# Patient Record
Sex: Female | Born: 1955 | Race: White | Hispanic: No | Marital: Married | Smoking: Former smoker
Health system: Southern US, Community
[De-identification: ages and names within clinical notes are randomized; demographics above are authoritative.]

## PROBLEM LIST (undated history)

## (undated) DIAGNOSIS — E7800 Pure hypercholesterolemia, unspecified: Secondary | ICD-10-CM

## (undated) DIAGNOSIS — K219 Gastro-esophageal reflux disease without esophagitis: Secondary | ICD-10-CM

## (undated) DIAGNOSIS — F32A Depression, unspecified: Secondary | ICD-10-CM

## (undated) DIAGNOSIS — F329 Major depressive disorder, single episode, unspecified: Secondary | ICD-10-CM

## (undated) HISTORY — PX: COMBINED HYSTEROSCOPY DIAGNOSTIC / D&C: SUR297

## (undated) HISTORY — PX: TUBAL LIGATION: SHX77

## (undated) HISTORY — PX: TONSILLECTOMY: SUR1361

## (undated) HISTORY — PX: LIPOMA EXCISION: SHX5283

---

## 1993-02-27 DIAGNOSIS — F32A Depression, unspecified: Secondary | ICD-10-CM | POA: Insufficient documentation

## 2006-05-31 ENCOUNTER — Ambulatory Visit (HOSPITAL_COMMUNITY): Admission: RE | Admit: 2006-05-31 | Discharge: 2006-05-31 | Payer: Self-pay | Admitting: Pulmonary Disease

## 2006-06-05 ENCOUNTER — Ambulatory Visit (HOSPITAL_COMMUNITY): Admission: RE | Admit: 2006-06-05 | Discharge: 2006-06-05 | Payer: Self-pay | Admitting: Pulmonary Disease

## 2007-02-05 ENCOUNTER — Ambulatory Visit: Payer: Self-pay | Admitting: Gastroenterology

## 2007-02-06 ENCOUNTER — Ambulatory Visit: Payer: Self-pay | Admitting: Gastroenterology

## 2007-02-06 ENCOUNTER — Ambulatory Visit (HOSPITAL_COMMUNITY): Admission: RE | Admit: 2007-02-06 | Discharge: 2007-02-06 | Payer: Self-pay | Admitting: Gastroenterology

## 2007-02-28 DIAGNOSIS — M810 Age-related osteoporosis without current pathological fracture: Secondary | ICD-10-CM | POA: Insufficient documentation

## 2007-03-19 ENCOUNTER — Ambulatory Visit: Payer: Self-pay | Admitting: Gastroenterology

## 2007-09-26 DIAGNOSIS — O039 Complete or unspecified spontaneous abortion without complication: Secondary | ICD-10-CM | POA: Insufficient documentation

## 2007-09-26 DIAGNOSIS — K648 Other hemorrhoids: Secondary | ICD-10-CM | POA: Insufficient documentation

## 2007-09-26 DIAGNOSIS — N951 Menopausal and female climacteric states: Secondary | ICD-10-CM | POA: Insufficient documentation

## 2007-09-26 DIAGNOSIS — F341 Dysthymic disorder: Secondary | ICD-10-CM

## 2007-09-26 DIAGNOSIS — K625 Hemorrhage of anus and rectum: Secondary | ICD-10-CM

## 2007-09-26 DIAGNOSIS — M545 Low back pain: Secondary | ICD-10-CM

## 2007-09-26 DIAGNOSIS — Z87891 Personal history of nicotine dependence: Secondary | ICD-10-CM

## 2007-09-26 DIAGNOSIS — K921 Melena: Secondary | ICD-10-CM | POA: Insufficient documentation

## 2007-09-26 DIAGNOSIS — J189 Pneumonia, unspecified organism: Secondary | ICD-10-CM | POA: Insufficient documentation

## 2010-06-29 NOTE — Consult Note (Signed)
Daisy Wu NO.:  [id number]   MEDICAL RECORD NO.:  [id number]          PATIENT TYPE:  AMB   LOCATION:  DAY                           FACILITY:  [facility]   PHYSICIAN:  Kassie Mends, M.D.      DATE OF BIRTH:  [birth year]   DATE OF CONSULTATION:  DATE OF DISCHARGE:                                 CONSULTATION   REASON FOR CONSULTATION:  Rectal bleeding.   HISTORY OF PRESENT ILLNESS:  Daisy Wu is a 55 year old female who  was sent for a routine screening colonoscopy.  Upon further triage, she  was complaining of a longstanding history of intermittent rectal  bleeding.  She was therefore brought to the office to discuss this  further prior to venturing into a colonoscopy.  She describes a one-year  history of intermittent bright red blood per rectum in large amounts  with clots at times.  She says that it has definitely increased in  frequency over the last 2 months, especially when she is having a bowel  movement.  She is having severe proctalgia.  She is having at least 2-3  episodes of bleeding per week.  She has noted occasional straining and  the sense of being constipated but does have a bowel movement every day.  She also complains of low back pain for the last 3 weeks, for which she  has taken ibuprofen 400 mg about q.4h. for this.  She denies any  abdominal pain, denies any nausea or vomiting.  Her weight has remained  stable.  Her appetite is good.   PAST MEDICAL/SURGICAL HISTORY:  1. A 3-week history of low back pain.  2. She had pneumonia last year.  3. She has had a miscarriage.  4. She is perimenopausal.  5. She has a history of anxiety and depression and has been on Zoloft      since July 2008.  6. She is status post tonsillectomy.  7. She has had two D&C's and a tubal ligation.   CURRENT MEDICATIONS:  1. Zoloft 50 mg daily.  2. Ibuprofen 400 mg q.4h. p.r.n.   ALLERGIES:  BACTRIM.   FAMILY HISTORY:  Mother does have history of  diverticulitis.  She is age  2.  Father age 76 with history of hypertension, renal lithiasis and  rheumatoid arthritis.  She has two other sisters.   SOCIAL HISTORY:  Daisy Wu is married.  She has one healthy son and  daughter.  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  She has a 12-year history of tobacco use, quitting 21 years ago.  She  consumes about two glasses of wine per week.  Denies any drug use.   REVIEW OF SYSTEMS:  See HPI.  GI:  She denies any heartburn or  indigestion, dysphagia or odynophagia.  Denies any early satiety.  GU:  Denies any dysuria or hematuria, increasing urinary frequency.  HEMATOLOGIC:  Denies any other history of bleeding or easy bruising or  history of blood dyscrasias.   PHYSICAL EXAM:  Weight 140.5 pounds, height 67 inches.  Temperature  97.8, blood pressure  110/78, pulse 60.  GENERAL:  She is a well-developed, well-nourished Caucasian female in no  acute distress.  HEENT:  Sclerae are clear, nonicteric.  Conjunctivae pink.  Oropharynx  pink and moist without any lesions.  NECK:  Supple without any adenopathy or thyromegaly.  CHEST:  Heart regular rate and rhythm, normal S1, S2, without murmurs,  clicks, thrills or gallops.  Lungs are clear to auscultation  bilaterally.  ABDOMEN:  Positive bowel sounds x4.  No bruits auscultated.  Soft,  nontender, nondistended, without palpable mass or hepatosplenomegaly, no  rebound tenderness or guarding.  EXTREMITIES:  Without clubbing or edema bilaterally.  RECTAL:  Deferred given the fact that we are setting her up for  colonoscopy.   IMPRESSION:  Daisy Wu is a 55 year old female who has had  intermittent rectal bleeding, at times in large amount, over the last  year but more recently over the last few months.  Interestingly, she has  been taking a significant amount of ibuprofen over the last 3 weeks,  given low back pain.  I suspect that the ibuprofen could be  contributing  to her bleeding source, unknown, which could include anything from  benign anorectal source such as hemorrhoids or fissure, diverticula, or  polyps.  Less likely would be inflammatory bowel disease or colorectal  carcinoma.   PLAN:  1. Colonoscopy with Dr. Cira Servant in the near future.  I have discussed      the procedure including the risks and benefits, which include but      are not limited to bleeding, infection, perforation, drug reaction.      She agrees and a planned consent will be obtained.  2. She is to hold her ibuprofen until after the colonoscopy.  3. Further recommendations to follow.   We would like to thank Ninfa Linden, NP, for allowing [place] to  participate in the care of Daisy Wu.   ADDENDUM:  TCS: 12/08-Rectal bleeding secondary to hemorrhoids.      Lorenza Burton, N.P.      Kassie Mends, M.D.  Electronically Signed    KJ/MEDQ  D:  02/05/2007  T:  02/05/2007  Job:  [id number]   cc:   Ninfa Linden, NP  [place], [place]

## 2010-06-29 NOTE — Assessment & Plan Note (Signed)
NAMEMarland Kitchen  RISE, TRAEGER              CHART#:  [id number]   DATE:  03/19/2007                       DOB:  03-04-1955   CHIEF COMPLAINT:  Follow-up colonoscopy for hematochezia.   HISTORY OF PRESENT ILLNESS:  Patient is a 55 year old female.  She  underwent ileocolonoscopy for rectal bleeding.  She was found to have  moderate internal hemorrhoids and an otherwise normal exam.  She was  given Anusol-HC suppositories and has done well.  She has had no further  bleeding.  She is feeling much better.  She has not taken as much  ibuprofen as she had previously.  She denies any proctalgia at this  point.  She is having a bowel movement every day.  She is drinking  plenty of water.  Denies any abdominal pain.   CURRENT MEDICATIONS:  See the list from 03/19/07.   ALLERGIES:  Bactrim.   OBJECTIVE:  VITAL SIGNS:  Weight 144 pounds.  Five foot, 7 inches. Temp  98.2.  Blood pressure 98/62.  Pulse 60.  GENERAL:  She is a well-developed and well-nourished Caucasian female in  no acute distress.  HEENT:  Sclerae are clear and nonicteric.  Conjunctivae are pink.  Oropharynx pink and moist without any lesions.  CHEST:  Heart regular rate and rhythm with normal S1 and S2. ABDOMEN:  Positive bowel sounds x4.  No bruits auscultated.  Soft, nontender,  nondistended without palpable mass or hepatosplenomegaly or rebound  tenderness or guarding.  EXTREMITIES:  Without clubbing or edema.   ASSESSMENT:  Patient is a 55 year old female with hemorrhoids as a  source of her rectal bleeding.  Symptoms have resolved at this point.   PLAN:  1. Screening colonoscopy in 10 years.  2. Should she have recurrent proctalgia or rectal bleeding, would      consider retreatment versus      referral to surgeon for hemorrhoidectomy.  3. Continue constipation precautions and high-fiber diet.       Lorenza Burton, N.P.  Electronically Signed     Kassie Mends, M.D.  Electronically Signed    KJ/MEDQ  D:   03/19/2007  T:  03/20/2007  Job:  [id number]

## 2010-06-29 NOTE — Op Note (Signed)
Daisy Wu, Daisy Wu NO.:  [id number]   MEDICAL RECORD NO.:  [id number]          PATIENT TYPE:  AMB   LOCATION:  DAY                           FACILITY:  [facility]   PHYSICIAN:  Kassie Mends, M.D.      DATE OF BIRTH:  Dec 02, 1955   DATE OF PROCEDURE:  02/06/2007  DATE OF DISCHARGE:                               OPERATIVE REPORT   REFERRING PHYSICIAN:  Ninfa Linden, NP, [place], [place].   PROCEDURE:  Ileocolonoscopy.   INDICATION FOR EXAM:  Daisy Wu is a 55 year old female who  presents with rectal bleeding.  She also requires colon cancer  screening. She often has rectal bleeding off and on.  The rectal  bleeding is especially worse with bowel movements.  She bleeds two to  three times a week.  She occasionally strains due to a sense of feeling  constipated.  She also uses ibuprofen frequently for low back pain.   FINDINGS:  1. Normal terminal ileum, 20 cm visualized.  2. Normal colon without evidence of polyps, masses, diverticula,      inflammatory changes or AVMs.  3. Moderate internal hemorrhoids.  Otherwise normal retroflexed view      of the rectum.   DIAGNOSIS:  Daisy Wu rectal bleeding is secondary to  hemorrhoids.   RECOMMENDATIONS:  1. Screening colonoscopy in 10 years.  2. Anusol-HC twice daily for 14 days.  3. She should drink six to eight cups of water daily.  She should have      add Benefiber or Fibersure daily.  She should follow a high fiber      diet.  She is given a handout on high-fiber diet and hemorrhoids.  4. Follow-up appointment in 6 weeks with Lorenza Burton to reevaluate      her rectal bleeding and constipation.  If her rectal bleeding      persists, then she would need a general surgery consultation to      consider hemorrhoidectomy.   MEDICATIONS:  1. Demerol 50 mg IV.  2. Versed 5 mg IV.   PROCEDURE TECHNIQUE:  Physical exam was performed, informed consent was  obtained from the patient after explaining the  benefits, risks and  alternatives to the procedure.  The patient was connected to the monitor  and placed in the left lateral position.  Continuous oxygen was provided  by nasal cannula and IV medicine administered through an indwelling  cannula.  After administration of sedation and rectal exam, the  patient's rectum was intubated  and the scope was advanced under direct visualization to the terminal  ileum.  The scope was removed slowly by carefully examining the color,  texture, anatomy and integrity of the mucosa on the way out.  The  patient was recovered in endoscopy and discharged home in satisfactory  condition.      Kassie Mends, M.D.  Electronically Signed     SM/MEDQ  D:  02/06/2007  T:  02/06/2007  Job:  [id number]   cc:   Ninfa Linden, NP  [place], [place]

## 2010-07-02 NOTE — Procedures (Signed)
Daisy Wu, HEGEMAN             ACCOUNT NO.:  [id number]   MEDICAL RECORD NO.:  [id number]          PATIENT TYPE:  OUT   LOCATION:  RESP                          FACILITY:  [facility]   PHYSICIAN:  Edward L. Juanetta Gosling, M.D.DATE OF BIRTH:  Jul 23, 1955   DATE OF PROCEDURE:  DATE OF DISCHARGE:  06/05/2006                            PULMONARY FUNCTION TEST   1. Spirometry shows no ventilatory defect but does show evidence of      airflow obstruction.  2. Lung volumes are normal.  3. DLCO is normal.  4. There is no significant bronchodilator effect.      Edward L. Juanetta Gosling, M.D.  Electronically Signed     ELH/MEDQ  D:  06/07/2006  T:  06/08/2006  Job:  [id number]

## 2011-11-30 ENCOUNTER — Emergency Department (HOSPITAL_COMMUNITY): Payer: BC Managed Care – PPO

## 2011-11-30 ENCOUNTER — Emergency Department (HOSPITAL_COMMUNITY)
Admission: EM | Admit: 2011-11-30 | Discharge: 2011-12-01 | Disposition: A | Discharge status: Home / Self Care | Payer: BC Managed Care – PPO | Attending: Emergency Medicine | Admitting: Emergency Medicine

## 2011-11-30 ENCOUNTER — Encounter (HOSPITAL_COMMUNITY): Payer: Self-pay | Admitting: Emergency Medicine

## 2011-11-30 DIAGNOSIS — S82899A Other fracture of unspecified lower leg, initial encounter for closed fracture: Secondary | ICD-10-CM | POA: Insufficient documentation

## 2011-11-30 DIAGNOSIS — S82892A Other fracture of left lower leg, initial encounter for closed fracture: Secondary | ICD-10-CM

## 2011-11-30 DIAGNOSIS — X500XXA Overexertion from strenuous movement or load, initial encounter: Secondary | ICD-10-CM | POA: Insufficient documentation

## 2011-11-30 HISTORY — DX: Depression, unspecified: F32.A

## 2011-11-30 HISTORY — DX: Major depressive disorder, single episode, unspecified: F32.9

## 2011-11-30 MED ORDER — OXYCODONE-ACETAMINOPHEN 5-325 MG PO TABS
1.0000 | ORAL_TABLET | Freq: Once | ORAL | Status: AC
  Administered 2011-11-30: 1 via ORAL
  Filled 2011-11-30: qty 1

## 2011-11-30 MED ORDER — OXYCODONE-ACETAMINOPHEN 5-325 MG PO TABS: 1.0000 | ORAL_TABLET | ORAL | Status: DC | PRN

## 2011-11-30 NOTE — ED Notes (Signed)
States around 6 pm was walking and twisted her left ankle causing her to fall to the ground.  States her left ankle is painful and swollen.

## 2011-11-30 NOTE — ED Notes (Signed)
Patient states she was jogging and turned her ankle, falling. Complaining of pain in left ankle. Swelling noted to left ankle at triage.

## 2011-11-30 NOTE — ED Provider Notes (Signed)
History     CSN: [id number]  Arrival date & time 11/30/11  1928   First MD Initiated Contact with Patient 11/30/11 2140      Chief Complaint  Patient presents with  . Ankle Injury    HPI Daisy Wu is a 56 y.o. female who presents to the ED with ankle pain. The pain started when she turned her ankle while jogging today. The pain is located in the left ankle. There is swelling. The history was provided by the patient.  Past Medical History  Diagnosis Date  . Depression     History reviewed. No pertinent past surgical history.  History reviewed. No pertinent family history.  History  Substance Use Topics  . Smoking status: Not on file  . Smokeless tobacco: Not on file  . Alcohol Use: Yes     occ    OB History    Grav Para Term Preterm Abortions TAB SAB Ect Mult Living                  Review of Systems  Musculoskeletal: Positive for joint swelling.       Ankle injury    Allergies  Sulfamethoxazole w-trimethoprim  Home Medications  No current outpatient prescriptions on file.  BP 130/74  Pulse 89  Temp 98.5 F (36.9 C) (Oral)  Resp 14  Ht 5\' 7"  (1.702 m)  Wt 145 lb (65.772 kg)  BMI 22.71 kg/m2  SpO2 100%  Physical Exam  Nursing note and vitals reviewed. Constitutional: She is oriented to person, place, and time. She appears well-developed and well-nourished. No distress.  HENT:  Head: Normocephalic and atraumatic.  Eyes: EOM are normal. Pupils are equal, round, and reactive to light.  Neck: Neck supple.  Cardiovascular: Normal rate.   Pulmonary/Chest: Effort normal.  Musculoskeletal:       Left ankle: She exhibits swelling. tenderness. Lateral malleolus tenderness found.       Feet:       Pedal pulses present, adequate circulation, good touch sensation.  Neurological: She is alert and oriented to person, place, and time. No cranial nerve deficit.  Skin: Skin is warm and dry.  Psychiatric: She has a normal mood and affect. Her behavior is  normal. Judgment and thought content normal.    ED Course  Procedures Labs Reviewed - No data to display Dg Ankle Complete Left  11/30/2011  *RADIOLOGY REPORT*  Clinical Data: Lateral ankle pain after fall.  LEFT ANKLE COMPLETE - 3+ VIEW  Comparison: None.  Findings: There is a transverse fracture of the distal left fibula with overlying soft tissue swelling.  No significant displacement of the fracture fragments.  Distal tibia and talus appear intact. No radiopaque soft tissue foreign bodies.  IMPRESSION: Transverse fracture of the left lateral malleolus with soft tissue swelling.   Original Report Authenticated By: Marlon Pel, M.D.    Assessment: 56 y.o. female with left ankle injury   Ankle fracture  Plan:  Posterior plaster splint   Crutches   Pain management, elevate, ice, follow up with ortho  Discussed with the patient and all questioned fully answered.    Medication List     As of 11/30/2011 11:34 PM    START taking these medications         * oxyCODONE-acetaminophen 5-325 MG per tablet   Commonly known as: PERCOCET/ROXICET   Take 1 tablet by mouth every 4 (four) hours as needed for pain.      * oxyCODONE-acetaminophen 5-325  MG per tablet   Commonly known as: PERCOCET/ROXICET   Take 1 tablet by mouth every 4 (four) hours as needed for pain.     * Notice: This list has 2 medication(s) that are the same as other medications prescribed for you. Read the directions carefully, and ask your doctor or other care provider to review them with you.    ASK your doctor about these medications         alendronate 70 MG tablet   Commonly known as: FOSAMAX      CALCIUM 600 + D PO      ibuprofen 200 MG tablet   Commonly known as: ADVIL,MOTRIN      sertraline 50 MG tablet   Commonly known as: ZOLOFT          Where to get your medications    These are the prescriptions that you need to pick up.   You may get these medications from any pharmacy.          oxyCODONE-acetaminophen 5-325 MG per tablet                 Janne Napoleon, NP 11/30/11 2334

## 2011-11-30 NOTE — ED Provider Notes (Signed)
Medical screening examination/treatment/procedure(s) were performed by non-physician practitioner and as supervising physician I was immediately available for consultation/collaboration. Gillermo Poch, MD, FACEP   Sanyia Dini L Nihal Marzella, MD 11/30/11 2339 

## 2011-12-01 ENCOUNTER — Ambulatory Visit (INDEPENDENT_AMBULATORY_CARE_PROVIDER_SITE_OTHER): Payer: BC Managed Care – PPO | Admitting: Orthopedic Surgery

## 2011-12-01 ENCOUNTER — Encounter: Payer: Self-pay | Admitting: Orthopedic Surgery

## 2011-12-01 VITALS — BP 80/50 | Ht 67.0 in | Wt 145.0 lb

## 2011-12-01 DIAGNOSIS — S82409A Unspecified fracture of shaft of unspecified fibula, initial encounter for closed fracture: Secondary | ICD-10-CM

## 2011-12-01 MED ORDER — HYDROCODONE-ACETAMINOPHEN 7.5-325 MG PO TABS: 1.0000 | ORAL_TABLET | ORAL | Status: DC | PRN

## 2011-12-01 NOTE — Patient Instructions (Addendum)
Weight bearing as tolerated   Crutches as needed   Elevate and ice to control swelling

## 2011-12-01 NOTE — Progress Notes (Signed)
Subjective:     Patient ID: Daisy Wu, female   DOB: [birth year], 56 y.o.   MRN: [id number]  Ankle Injury  Incident onset: 10.16.2013. The incident occurred in the street. The injury mechanism was a fall, an inversion injury and a twisting injury. The pain is present in the left ankle. The quality of the pain is described as stabbing. The pain is at a severity of 8/10. The pain has been intermittent since onset. Associated symptoms include an inability to bear weight, a loss of motion, a loss of sensation, numbness and tingling.     Review of Systems  Constitutional: Negative.   HENT: Negative.   Gastrointestinal: Positive for constipation.  Genitourinary: Negative.   Neurological: Positive for tingling and numbness.       Objective:   Physical Exam  Nursing note and vitals reviewed. Constitutional: She is oriented to person, place, and time. She appears well-developed and well-nourished.  HENT:  Head: Normocephalic.  Eyes: Pupils are equal, round, and reactive to light.  Neck: Normal range of motion.  Cardiovascular: Normal rate and intact distal pulses.   Pulmonary/Chest: Effort normal. No respiratory distress.  Abdominal: Bowel sounds are normal. She exhibits no distension.  Musculoskeletal:       Left ankle  Swelling and tenderness  Decreased range and medial deltoid tenderness  Motor defer pain  Stability defer pain   Neurological: She is oriented to person, place, and time. She has normal reflexes. She displays normal reflexes. No cranial nerve deficit. She exhibits normal muscle tone. Coordination normal.  Skin: Skin is warm and dry. No rash noted. No erythema. No pallor.  Psychiatric: She has a normal mood and affect. Her behavior is normal. Judgment and thought content normal.       Assessment:     X rays Weber A fibula fracture     Plan:     WBAT x 6 weeks in LONG CAM walker   xrays in 6 weeks

## 2012-01-16 ENCOUNTER — Ambulatory Visit (HOSPITAL_COMMUNITY)
Admission: RE | Admit: 2012-01-16 | Discharge: 2012-01-16 | Disposition: A | Discharge status: Home / Self Care | Payer: BC Managed Care – PPO | Source: Ambulatory Visit | Attending: Orthopedic Surgery | Admitting: Orthopedic Surgery

## 2012-01-16 ENCOUNTER — Ambulatory Visit (INDEPENDENT_AMBULATORY_CARE_PROVIDER_SITE_OTHER): Payer: BC Managed Care – PPO | Admitting: Orthopedic Surgery

## 2012-01-16 ENCOUNTER — Encounter: Payer: Self-pay | Admitting: Orthopedic Surgery

## 2012-01-16 VITALS — Ht 67.0 in | Wt 145.0 lb

## 2012-01-16 DIAGNOSIS — S8263XA Displaced fracture of lateral malleolus of unspecified fibula, initial encounter for closed fracture: Secondary | ICD-10-CM | POA: Insufficient documentation

## 2012-01-16 DIAGNOSIS — S82899A Other fracture of unspecified lower leg, initial encounter for closed fracture: Secondary | ICD-10-CM

## 2012-01-16 DIAGNOSIS — X58XXXA Exposure to other specified factors, initial encounter: Secondary | ICD-10-CM | POA: Insufficient documentation

## 2012-01-16 NOTE — Progress Notes (Signed)
Patient ID: Daisy Wu, female   DOB: [birth year], 56 y.o.   MRN: [id number] Chief Complaint  Patient presents with  . Follow-up    6 week recheck on left ankle.Fracture Weber A./Injury date October 16   1. Fracture, ankle  DG Ankle Complete Left   Date of injury October 16  Weber A ankle fracture    Today's x-ray was performed at the refill. Diagnostic center.  X-ray shows that the fractures progressing towards healing in a normal fashion with no displacement  Clinically she is still tender at the fracture site   Continue cam walker x 2 weeks then xr in 4

## 2012-01-16 NOTE — Patient Instructions (Addendum)
Wear boot 2 more weeks

## 2012-02-14 ENCOUNTER — Ambulatory Visit (INDEPENDENT_AMBULATORY_CARE_PROVIDER_SITE_OTHER): Payer: BC Managed Care – PPO

## 2012-02-14 ENCOUNTER — Ambulatory Visit (INDEPENDENT_AMBULATORY_CARE_PROVIDER_SITE_OTHER): Payer: BC Managed Care – PPO | Admitting: Orthopedic Surgery

## 2012-02-14 VITALS — BP 102/70 | Ht 67.0 in | Wt 145.0 lb

## 2012-02-14 DIAGNOSIS — M24876 Other specific joint derangements of unspecified foot, not elsewhere classified: Secondary | ICD-10-CM

## 2012-02-14 DIAGNOSIS — S82899A Other fracture of unspecified lower leg, initial encounter for closed fracture: Secondary | ICD-10-CM

## 2012-02-14 DIAGNOSIS — M25373 Other instability, unspecified ankle: Secondary | ICD-10-CM

## 2012-02-14 NOTE — Patient Instructions (Addendum)
aso brace x 4 weeks left ankle   Please call the hospital to START PHYSICAL THERAPY

## 2012-02-14 NOTE — Progress Notes (Signed)
Patient ID: Daisy Wu, female   DOB: Mar 06, 1955, 56 y.o.   MRN: [id number] Chief Complaint  Patient presents with  . Follow-up    4 week recheck on left ankle fracture with xray. DOI 11-30-11.    Left distal fibula fracture. Followup x-ray  Patient went to pain anterior to the bone along the lateral collateral ligaments and also has a new injury fell and injured her right ankle with a sprain  Exam on the left side shows no tenderness at the fracture site. Negative palpation and percussion test. But she is tender with loose ligaments anterior talofibular calcaneofibular  Range of motion is normal. She has abnormal drawer test was painful inversion  Right side abnormal drawer test sulcus sign, mild tenderness normal range of motion  Diagnosis #1 fibular fracture healed Diagnosis #2 ankle sprain chronic Diagnoses #3 instability ankle bilateral  Recommend ASO brace on the left  Physical therapy  Return 6 weeks

## 2012-03-31 ENCOUNTER — Other Ambulatory Visit: Payer: Self-pay

## 2012-04-03 ENCOUNTER — Ambulatory Visit (INDEPENDENT_AMBULATORY_CARE_PROVIDER_SITE_OTHER): Payer: BC Managed Care – PPO | Admitting: Orthopedic Surgery

## 2012-04-03 VITALS — BP 120/60 | Ht 67.0 in | Wt 145.0 lb

## 2012-04-03 DIAGNOSIS — S93409A Sprain of unspecified ligament of unspecified ankle, initial encounter: Secondary | ICD-10-CM

## 2012-04-03 DIAGNOSIS — S82409A Unspecified fracture of shaft of unspecified fibula, initial encounter for closed fracture: Secondary | ICD-10-CM

## 2012-04-03 NOTE — Patient Instructions (Addendum)
Apply aspercreme or capzacin tid as needed   Need exercises to continue on the left side

## 2012-04-03 NOTE — Progress Notes (Signed)
Patient ID: Daisy Wu, female   DOB: Sep 22, 1955, 57 y.o.   MRN: [id number] Chief Complaint  Patient presents with  . Follow-up    follow up post bilateral ankle injuries and PT    Patient reports continued pain in her left ankle especially she's going down the steps. She's completed her physical therapy course as well as a home physical therapy course after bilateral ankle injuries including a distal fibular avulsion type fracture.  Review of systems she denies any neurologic complaints of numbness and tingling in the foot or ankle either side  Bilateral ankle examination for starting with the right ankle. 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  On the left side she also has a bunion deformity flat feet which is flexible. She has some tenderness around the ankle joint fibula is nontender range of motion is full she can perform double leg tiptoe as well as a single leg tiptoe although on the right side this is painful  Fracture of fibula  First degree ankle sprain   Overall she is improved recommend continued exercises as well as topical anti-inflammatories as needed.  She does not want to address the bunion deformities at this time they are not painful or bothering her.  Followup as needed

## 2012-12-20 ENCOUNTER — Other Ambulatory Visit: Payer: Self-pay

## 2017-01-17 ENCOUNTER — Encounter: Payer: Self-pay | Admitting: Gastroenterology

## 2017-02-02 ENCOUNTER — Other Ambulatory Visit: Payer: Self-pay | Admitting: *Deleted

## 2017-02-02 ENCOUNTER — Encounter: Payer: Self-pay | Admitting: *Deleted

## 2017-02-02 ENCOUNTER — Encounter: Payer: Self-pay | Admitting: Gastroenterology

## 2017-02-02 ENCOUNTER — Ambulatory Visit: Payer: BC Managed Care – PPO | Admitting: Gastroenterology

## 2017-02-02 VITALS — BP 114/74 | HR 82 | Temp 97.4°F | Ht 65.25 in | Wt 149.8 lb

## 2017-02-02 DIAGNOSIS — K648 Other hemorrhoids: Secondary | ICD-10-CM

## 2017-02-02 DIAGNOSIS — K625 Hemorrhage of anus and rectum: Secondary | ICD-10-CM

## 2017-02-02 MED ORDER — CLENPIQ 10-3.5-12 MG-GM -GM/160ML PO SOLN: 1.0000 | Freq: Once | ORAL | 0 refills | Status: AC

## 2017-02-02 NOTE — Progress Notes (Addendum)
Primary Care Physician:  Asencion Noble, MD  Primary Gastroenterologist:  Barney Drain, MD REVIEWED-NO ADDITIONAL RECOMMENDATIONS.  Chief Complaint  Patient presents with  . Colonoscopy    last tcs at least 10 yrs ago  . Rectal Bleeding  . Hemorrhoids    HPI:  Daisy Wu is a 61 y.o. female here to discuss colonoscopy.  Last colonoscopy 10 years ago, she had moderate internal hemorrhoids at the time.  She complains of ongoing issues with intermittent rectal bleeding which she feels is hemorrhoid related.  Does not happen all the time but can happen for weeks at a time and then she gets some break in between.  Has been happening for several years.  No melena.  No rectal pain.  No constipation or diarrhea.  No weight loss.  No upper GI symptoms.  Current Outpatient Medications  Medication Sig Dispense Refill  . Calcium Carbonate-Vitamin D (CALCIUM 600 + D PO) Take 1 tablet by mouth daily.    . cholecalciferol (VITAMIN D) 1000 units tablet Take 1,000 Units by mouth daily.    . ferrous sulfate 325 (65 FE) MG tablet Take 325 mg by mouth daily.    . sertraline (ZOLOFT) 50 MG tablet Take 25 mg by mouth every other day.      No current facility-administered medications for this visit.     Allergies as of 02/02/2017 - Review Complete 02/02/2017  Allergen Reaction Noted  . Sulfamethoxazole-trimethoprim      Past Medical History:  Diagnosis Date  . Depression     Past Surgical History:  Procedure Laterality Date  . COMBINED HYSTEROSCOPY DIAGNOSTIC / D&C    . TONSILLECTOMY    . TUBAL LIGATION      Family History  Problem Relation Age of Onset  . Heart disease Unknown   . Arthritis Unknown   . Cancer Unknown     Social History   Socioeconomic History  . Marital status: Married    Spouse name: Not on file  . Number of children: Not on file  . Years of education: Not on file  . Highest education level: Not on file  Social Needs  . Financial resource strain: Not on file   . Food insecurity - worry: Not on file  . Food insecurity - inability: Not on file  . Transportation needs - medical: Not on file  . Transportation needs - non-medical: Not on file  Occupational History  . Not on file  Tobacco Use  . Smoking status: Former Research scientist (life sciences)  . Smokeless tobacco: Never Used  Substance and Sexual Activity  . Alcohol use: Yes    Comment: bottle of wine every other week  . Drug use: No  . Sexual activity: Not on file  Other Topics Concern  . Not on file  Social History Narrative  . Not on file      ROS:  General: Negative for anorexia, weight loss, fever, chills, fatigue, weakness. Eyes: Negative for vision changes.  ENT: Negative for hoarseness, difficulty swallowing , nasal congestion. CV: Negative for chest pain, angina, palpitations, dyspnea on exertion, peripheral edema.  Respiratory: Negative for dyspnea at rest, dyspnea on exertion, cough, sputum, wheezing.  GI: See history of present illness. GU:  Negative for dysuria, hematuria, urinary incontinence, urinary frequency, nocturnal urination.  MS: Negative for joint pain, low back pain.  Derm: Negative for rash or itching.  Neuro: Negative for weakness, abnormal sensation, seizure, frequent headaches, memory loss, confusion.  Psych: Negative for anxiety, depression, suicidal ideation,  hallucinations.  Endo: Negative for unusual weight change.  Heme: Negative for bruising or bleeding. Allergy: Negative for rash or hives.    Physical Examination:  BP 114/74   Pulse 82   Temp (!) 97.4 F (36.3 C) (Oral)   Ht 5' 5.25" (1.657 m)   Wt 149 lb 12.8 oz (67.9 kg)   BMI 24.74 kg/m    General: Well-nourished, well-developed in no acute distress.  Head: Normocephalic, atraumatic.   Eyes: Conjunctiva pink, no icterus. Mouth: Oropharyngeal mucosa moist and pink , no lesions erythema or exudate. Neck: Supple without thyromegaly, masses, or lymphadenopathy.  Lungs: Clear to auscultation bilaterally.   Heart: Regular rate and rhythm, no murmurs rubs or gallops.  Abdomen: Bowel sounds are normal, nontender, nondistended, no hepatosplenomegaly or masses, no abdominal bruits or    hernia , no rebound or guarding.   Rectal: Deferred Extremities: No lower extremity edema. No clubbing or deformities.  Neuro: Alert and oriented x 4 , grossly normal neurologically.  Skin: Warm and dry, no rash or jaundice.   Psych: Alert and cooperative, normal mood and affect.

## 2017-02-02 NOTE — Assessment & Plan Note (Signed)
61 year old female presenting to schedule 10-year follow-up colonoscopy but she complains of intermittent rectal bleeding possibly related to hemorrhoids.  She would like something done about her hemorrhoids.  Recommend colonoscopy with possible hemorrhoid banding in the near future.  Hold iron 7 days prior to procedure.  I have discussed the risks, alternatives, benefits with regards to but not limited to the risk of reaction to medication, bleeding, infection, perforation and the patient is agreeable to proceed. Written consent to be obtained.

## 2017-02-02 NOTE — Patient Instructions (Signed)
1. Colonoscopy with possible hemorrhoid banding as scheduled. See separate instructions.

## 2017-02-02 NOTE — Progress Notes (Signed)
CC'D TO PCP °

## 2017-02-20 ENCOUNTER — Encounter (HOSPITAL_COMMUNITY): Payer: Self-pay | Admitting: *Deleted

## 2017-02-20 ENCOUNTER — Ambulatory Visit (HOSPITAL_COMMUNITY)
Admission: RE | Admit: 2017-02-20 | Discharge: 2017-02-20 | Disposition: A | Discharge status: Home / Self Care | Payer: BC Managed Care – PPO | Source: Ambulatory Visit | Attending: Gastroenterology | Admitting: Gastroenterology

## 2017-02-20 ENCOUNTER — Other Ambulatory Visit: Payer: Self-pay

## 2017-02-20 ENCOUNTER — Encounter (HOSPITAL_COMMUNITY)
Admission: RE | Disposition: A | Discharge status: Home / Self Care | Payer: Self-pay | Source: Ambulatory Visit | Attending: Gastroenterology

## 2017-02-20 DIAGNOSIS — Q438 Other specified congenital malformations of intestine: Secondary | ICD-10-CM | POA: Diagnosis not present

## 2017-02-20 DIAGNOSIS — K648 Other hemorrhoids: Secondary | ICD-10-CM | POA: Diagnosis not present

## 2017-02-20 DIAGNOSIS — Z87891 Personal history of nicotine dependence: Secondary | ICD-10-CM | POA: Diagnosis not present

## 2017-02-20 DIAGNOSIS — F329 Major depressive disorder, single episode, unspecified: Secondary | ICD-10-CM | POA: Diagnosis not present

## 2017-02-20 DIAGNOSIS — Z882 Allergy status to sulfonamides status: Secondary | ICD-10-CM | POA: Diagnosis not present

## 2017-02-20 DIAGNOSIS — K921 Melena: Secondary | ICD-10-CM | POA: Diagnosis present

## 2017-02-20 DIAGNOSIS — Z79899 Other long term (current) drug therapy: Secondary | ICD-10-CM | POA: Diagnosis not present

## 2017-02-20 DIAGNOSIS — K644 Residual hemorrhoidal skin tags: Secondary | ICD-10-CM | POA: Insufficient documentation

## 2017-02-20 DIAGNOSIS — K625 Hemorrhage of anus and rectum: Secondary | ICD-10-CM

## 2017-02-20 HISTORY — PX: COLONOSCOPY: SHX5424

## 2017-02-20 HISTORY — PX: HEMORRHOID BANDING: SHX5850

## 2017-02-20 SURGERY — COLONOSCOPY
Anesthesia: Moderate Sedation

## 2017-02-20 MED ORDER — MEPERIDINE HCL 100 MG/ML IJ SOLN
INTRAMUSCULAR | Status: DC | PRN
  Administered 2017-02-20 (×2): 25 mg via INTRAVENOUS

## 2017-02-20 MED ORDER — SODIUM CHLORIDE 0.9 % IV SOLN
INTRAVENOUS | Status: DC
  Administered 2017-02-20: 09:00:00 via INTRAVENOUS

## 2017-02-20 MED ORDER — MIDAZOLAM HCL 5 MG/5ML IJ SOLN
INTRAMUSCULAR | Status: DC | PRN
  Administered 2017-02-20: 1 mg via INTRAVENOUS
  Administered 2017-02-20 (×2): 2 mg via INTRAVENOUS

## 2017-02-20 MED ORDER — MIDAZOLAM HCL 5 MG/5ML IJ SOLN
INTRAMUSCULAR | Status: AC
  Filled 2017-02-20: qty 10

## 2017-02-20 MED ORDER — MEPERIDINE HCL 100 MG/ML IJ SOLN
INTRAMUSCULAR | Status: AC
  Filled 2017-02-20: qty 2

## 2017-02-20 MED ORDER — STERILE WATER FOR IRRIGATION IR SOLN
Status: DC | PRN
  Administered 2017-02-20: 10:00:00

## 2017-02-20 NOTE — Op Note (Signed)
[facility] Patient Name: Daisy Wu Procedure Date: 02/20/2017 9:13 AM MRN: [id number] Date of Birth: 1955-12-17 Attending MD: Barney Drain MD, MD CSN: [id number] Age: 62 Admit Type: Outpatient Procedure:                Colonoscopy, DIAGNOSTIC Indications:              Hematochezia: < 1-2 TIMES PER MO Providers:                Barney Drain MD, MD, Otis Peak B. Sharon Seller, RN, Janeece Riggers, RN Referring MD:             Asencion Noble Medicines:                Meperidine 50 mg IV, Midazolam 5 mg IV Complications:            No immediate complications. Estimated Blood Loss:     Estimated blood loss: none. Procedure:                Pre-Anesthesia Assessment:                           - Prior to the procedure, a History and Physical                            was performed, and patient medications and                            allergies were reviewed. The patient's tolerance of                            previous anesthesia was also reviewed. The risks                            and benefits of the procedure and the sedation                            options and risks were discussed with the patient.                            All questions were answered, and informed consent                            was obtained. Prior Anticoagulants: The patient has                            taken no previous anticoagulant or antiplatelet                            agents. ASA Grade Assessment: II - A patient with                            mild systemic disease. After reviewing the risks  and benefits, the patient was deemed in                            satisfactory condition to undergo the procedure.                            After obtaining informed consent, the colonoscope                            was passed under direct vision. Throughout the                            procedure, the patient's blood pressure, pulse, and           oxygen saturations were monitored continuously. The                            EC-[id number] ([id number]) scope was introduced through                            the anus and advanced to the 5 cm into the ileum.                            The colonoscopy was technically difficult and                            complex due to a tortuous colon. Successful                            completion of the procedure was aided by increasing                            the dose of sedation medication, straightening and                            shortening the scope to obtain bowel loop reduction                            and COLOWRAP. The patient tolerated the procedure                            fairly well. The quality of the bowel preparation                            was excellent. The terminal ileum, ileocecal valve,                            appendiceal orifice, and rectum were photographed. Scope In: 9:42:37 AM Scope Out: 10:00:17 AM Scope Withdrawal Time: 0 hours 10 minutes 31 seconds  Total Procedure Duration: 0 hours 17 minutes 40 seconds  Findings:      The terminal ileum appeared normal.      The recto-sigmoid colon, sigmoid colon and descending colon were       moderately redundant.  The exam was otherwise without abnormality.      Non-bleeding internal hemorrhoids were found during retroflexion. The       hemorrhoids were small.      External hemorrhoids were found during retroflexion. The hemorrhoids       were moderate. Impression:               - The examined portion of the ileum was normal.                           - MODERATELY Redundant LEFT colon.                           - The examination was otherwise normal.                           - RECTAL BLEEDING DUE TO SMALL internal hemorrhoids.                           - External hemorrhoids. Moderate Sedation:      Moderate (conscious) sedation was administered by the endoscopy nurse       and supervised by the  endoscopist. The following parameters were       monitored: oxygen saturation, heart rate, blood pressure, and response       to care. Total physician intraservice time was 27 minutes. Recommendation:           - Repeat colonoscopy in 10 years for surveillance.                           - High fiber diet.                           - Continue present medications.                           - Patient has a contact number available for                            emergencies. The signs and symptoms of potential                            delayed complications were discussed with the                            patient. Return to normal activities tomorrow.                            Written discharge instructions were provided to the                            patient. Procedure Code(s):        --- Professional ---                           [phone], Colonoscopy, flexible; diagnostic, including  collection of specimen(s) by brushing or washing,                            when performed (separate procedure)                           99152, Moderate sedation services provided by the                            same physician or other qualified health care                            professional performing the diagnostic or                            therapeutic service that the sedation supports,                            requiring the presence of an independent trained                            observer to assist in the monitoring of the                            patient's level of consciousness and physiological                            status; initial 15 minutes of intraservice time,                            patient age 87 years or older                           [phone], Moderate sedation services; each additional                            15 minutes intraservice time Diagnosis Code(s):        --- Professional ---                           K64.4, Residual  hemorrhoidal skin tags                           K64.8, Other hemorrhoids                           K92.1, Melena (includes Hematochezia)                           Q43.8, Other specified congenital malformations of                            intestine CPT copyright 2016 American [employer] Association. All rights reserved. The codes documented in this report are preliminary and upon coder review may  be revised to meet current compliance requirements. Barney Drain, MD Barney Drain  MD, MD 02/20/2017 10:10:15 AM This report has been signed electronically. Number of Addenda: 0

## 2017-02-20 NOTE — H&P (Signed)
  Primary Care Physician:  Asencion Noble, MD Primary Gastroenterologist:  Dr. Oneida Alar  Pre-Procedure History & Physical: HPI:  Daisy Wu is a 62 y.o. female here for RECTAL BLEEDING.  Past Medical History:  Diagnosis Date  . Depression     Past Surgical History:  Procedure Laterality Date  . COMBINED HYSTEROSCOPY DIAGNOSTIC / D&C    . TONSILLECTOMY    . TUBAL LIGATION      Prior to Admission medications   Medication Sig Start Date End Date Taking? Authorizing Provider  Calcium Carbonate-Vitamin D (CALCIUM 600 + D PO) Take 1 tablet by mouth daily.   Yes [provider]  cholecalciferol (VITAMIN D) 1000 units tablet Take 1,000 Units by mouth daily.   Yes [provider]  ferrous sulfate 325 (65 FE) MG tablet Take 325 mg by mouth daily.   Yes [provider]  sertraline (ZOLOFT) 100 MG tablet Take 50 mg by mouth every other day.    Yes [provider]    Allergies as of 02/02/2017 - Review Complete 02/02/2017  Allergen Reaction Noted  . Sulfamethoxazole-trimethoprim      Family History  Problem Relation Age of Onset  . Heart disease Unknown   . Arthritis Unknown   . Cancer Unknown     Social History   Socioeconomic History  . Marital status: Married    Spouse name: Not on file  . Number of children: Not on file  . Years of education: Not on file  . Highest education level: Not on file  Social Needs  . Financial resource strain: Not on file  . Food insecurity - worry: Not on file  . Food insecurity - inability: Not on file  . Transportation needs - medical: Not on file  . Transportation needs - non-medical: Not on file  Occupational History  . Not on file  Tobacco Use  . Smoking status: Former Research scientist (life sciences)  . Smokeless tobacco: Never Used  Substance and Sexual Activity  . Alcohol use: Yes    Comment: bottle of wine every other week  . Drug use: No  . Sexual activity: Not on file  Other Topics Concern  . Not on file  Social  History Narrative  . Not on file    Review of Systems: See HPI, otherwise negative ROS   Physical Exam: BP 117/77   Pulse 81   Temp 97.9 F (36.6 C) (Oral)   Resp 13   Ht 5' 5.25" (1.657 m)   Wt 150 lb (68 kg)   SpO2 99%   BMI 24.77 kg/m  General:   Alert,  pleasant and cooperative in NAD Head:  Normocephalic and atraumatic. Neck:  Supple; Lungs:  Clear throughout to auscultation.    Heart:  Regular rate and rhythm. Abdomen:  Soft, nontender and nondistended. Normal bowel sounds, without guarding, and without rebound.   Neurologic:  Alert and  oriented x4;  grossly normal neurologically.  Impression/Plan:     RECTAL BLEEDING  PLAN:  1. TCS/? Hemorrhoid banding TODAY DISCUSSED PROCEDURE, BENEFITS, & RISKS: < 1% chance of medication reaction, bleeding, perforation, PELVIC VEIN SEPSIS, or rupture of spleen/liver.

## 2017-02-20 NOTE — Discharge Instructions (Signed)
You have moderate size EXTERNAL AND SMALL internal hemorrhoids. YOU DID NOT HAVE ANY POLYPS. THE LAST PART OF YOUR SMALL BOWEL IS NORMAL.   DRINK WATER TO KEEP YOUR URINE LIGHT YELLOW.  FOLLOW A HIGH FIBER DIET. AVOID ITEMS THAT CAUSE BLOATING. SEE INFO BELOW.  USE PREPARATION H FOUR TIMES  A DAY IF NEEDED TO RELIEVE RECTAL PAIN/PRESSURE/BLEEDING.  Next colonoscopy in 10 years(2029).  Colonoscopy Care After Read the instructions outlined below and refer to this sheet in the next week. These discharge instructions provide you with general information on caring for yourself after you leave the hospital. While your treatment has been planned according to the most current medical practices available, unavoidable complications occasionally occur. If you have any problems or questions after discharge, call DR. Richie Bonanno, [phone].  ACTIVITY  You may resume your regular activity, but move at a slower pace for the next 24 hours.   Take frequent rest periods for the next 24 hours.   Walking will help get rid of the air and reduce the bloated feeling in your belly (abdomen).   No driving for 24 hours (because of the medicine (anesthesia) used during the test).   You may shower.   Do not sign any important legal documents or operate any machinery for 24 hours (because of the anesthesia used during the test).    NUTRITION  Drink plenty of fluids.   You may resume your normal diet as instructed by your doctor.   Begin with a light meal and progress to your normal diet. Heavy or fried foods are harder to digest and may make you feel sick to your stomach (nauseated).   Avoid alcoholic beverages for 24 hours or as instructed.    MEDICATIONS  You may resume your normal medications.   WHAT YOU CAN EXPECT TODAY  Some feelings of bloating in the abdomen.   Passage of more gas than usual.   Spotting of blood in your stool or on the toilet paper  .  IF YOU HAD POLYPS REMOVED DURING  THE COLONOSCOPY:  Eat a soft diet IF YOU HAVE NAUSEA, BLOATING, ABDOMINAL PAIN, OR VOMITING.    FINDING OUT THE RESULTS OF YOUR TEST Not all test results are available during your visit. DR. Oneida Alar WILL CALL YOU WITHIN 14 DAYS OF YOUR PROCEDUE WITH YOUR RESULTS. Do not assume everything is normal if you have not heard from DR. Estreya Clay, CALL HER OFFICE AT [phone].  SEEK IMMEDIATE MEDICAL ATTENTION AND CALL THE OFFICE: [phone] IF:  You have more than a spotting of blood in your stool.   Your belly is swollen (abdominal distention).   You are nauseated or vomiting.   You have a temperature over 101F.   You have abdominal pain or discomfort that is severe or gets worse throughout the day.  High-Fiber Diet A high-fiber diet changes your normal diet to include more whole grains, legumes, fruits, and vegetables. Changes in the diet involve replacing refined carbohydrates with unrefined foods. The calorie level of the diet is essentially unchanged. The Dietary Reference Intake (recommended amount) for adult males is 38 grams per day. For adult females, it is 25 grams per day. Pregnant and lactating women should consume 28 grams of fiber per day. Fiber is the intact part of a plant that is not broken down during digestion. Functional fiber is fiber that has been isolated from the plant to provide a beneficial effect in the body. PURPOSE  Increase stool bulk.   Ease and regulate  bowel movements.   Lower cholesterol.   REDUCE RISK OF COLON CANCER  INDICATIONS THAT YOU NEED MORE FIBER  Constipation and hemorrhoids.   Uncomplicated diverticulosis (intestine condition) and irritable bowel syndrome.   Weight management.   As a protective measure against hardening of the arteries (atherosclerosis), diabetes, and cancer.   GUIDELINES FOR INCREASING FIBER IN THE DIET  Start adding fiber to the diet slowly. A gradual increase of about 5 more grams (2 slices of whole-wheat bread, 2  servings of most fruits or vegetables, or 1 bowl of high-fiber cereal) per day is best. Too rapid an increase in fiber may result in constipation, flatulence, and bloating.   Drink enough water and fluids to keep your urine clear or pale yellow. Water, juice, or caffeine-free drinks are recommended. Not drinking enough fluid may cause constipation.   Eat a variety of high-fiber foods rather than one type of fiber.   Try to increase your intake of fiber through using high-fiber foods rather than fiber pills or supplements that contain small amounts of fiber.   The goal is to change the types of food eaten. Do not supplement your present diet with high-fiber foods, but replace foods in your present diet.    INCLUDE A VARIETY OF FIBER SOURCES  Replace refined and processed grains with whole grains, canned fruits with fresh fruits, and incorporate other fiber sources. White rice, white breads, and most bakery goods contain little or no fiber.   Brown whole-grain rice, buckwheat oats, and many fruits and vegetables are all good sources of fiber. These include: broccoli, Brussels sprouts, cabbage, cauliflower, beets, sweet potatoes, white potatoes (skin on), carrots, tomatoes, eggplant, squash, berries, fresh fruits, and dried fruits.   Cereals appear to be the richest source of fiber. Cereal fiber is found in whole grains and bran. Bran is the fiber-rich outer coat of cereal grain, which is largely removed in refining. In whole-grain cereals, the bran remains. In breakfast cereals, the largest amount of fiber is found in those with "bran" in their names. The fiber content is sometimes indicated on the label.   You may need to include additional fruits and vegetables each day.   In baking, for 1 cup white flour, you may use the following substitutions:   1 cup whole-wheat flour minus 2 tablespoons.   1/2 cup white flour plus 1/2 cup whole-wheat flour.   Hemorrhoids Hemorrhoids are dilated  (enlarged) veins around the rectum. Sometimes clots will form in the veins. This makes them swollen and painful. These are called thrombosed hemorrhoids. Causes of hemorrhoids include:  Constipation.   Straining to have a bowel movement.   HEAVY LIFTING  HOME CARE INSTRUCTIONS  Eat a well balanced diet and drink 6 to 8 glasses of water every day to avoid constipation. You may also use a bulk laxative.   Avoid straining to have bowel movements.   Keep anal area dry and clean.   Do not use a donut shaped pillow or sit on the toilet for long periods. This increases blood pooling and pain.   Move your bowels when your body has the urge; this will require less straining and will decrease pain and pressure.

## 2017-02-22 ENCOUNTER — Encounter (HOSPITAL_COMMUNITY): Payer: Self-pay | Admitting: Gastroenterology

## 2020-01-25 ENCOUNTER — Emergency Department (HOSPITAL_COMMUNITY)
Admission: EM | Admit: 2020-01-25 | Discharge: 2020-01-25 | Disposition: A | Discharge status: Home / Self Care | Payer: Medicare PPO | Attending: Emergency Medicine | Admitting: Emergency Medicine

## 2020-01-25 ENCOUNTER — Emergency Department (HOSPITAL_COMMUNITY): Payer: Medicare PPO

## 2020-01-25 ENCOUNTER — Encounter (HOSPITAL_COMMUNITY): Payer: Self-pay | Admitting: *Deleted

## 2020-01-25 ENCOUNTER — Other Ambulatory Visit: Payer: Self-pay

## 2020-01-25 DIAGNOSIS — S6991XA Unspecified injury of right wrist, hand and finger(s), initial encounter: Secondary | ICD-10-CM | POA: Diagnosis present

## 2020-01-25 DIAGNOSIS — W010XXA Fall on same level from slipping, tripping and stumbling without subsequent striking against object, initial encounter: Secondary | ICD-10-CM | POA: Insufficient documentation

## 2020-01-25 DIAGNOSIS — S63501A Unspecified sprain of right wrist, initial encounter: Secondary | ICD-10-CM

## 2020-01-25 DIAGNOSIS — Z87891 Personal history of nicotine dependence: Secondary | ICD-10-CM | POA: Diagnosis not present

## 2020-01-25 HISTORY — DX: Pure hypercholesterolemia, unspecified: E78.00

## 2020-01-25 NOTE — Discharge Instructions (Signed)
Please read and follow all provided instructions.  You have been seen today for R wrist sprain.   Tests performed today include: An x-ray of the affected area - does NOT show any broken bones or dislocations.  Vital signs. See below for your results today.   Home care instructions: -- *PRICE in the first 24-48 hours after injury: Protect (with brace, splint, sling), if given by your provider Rest Ice- Do not apply ice pack directly to your skin, place towel or similar between your skin and ice/ice pack. Apply ice for 20 min, then remove for 40 min while awake Compression- Wear brace, elastic bandage, splint as directed by your provider Elevate affected extremity above the level of your heart when not walking around for the first 24-48 hours   Use Ibuprofen (Motrin/Advil) 600mg  every 6 hours as needed for pain (do not exceed max dose in 24 hours, 2400mg )  Follow-up instructions: Please follow-up with your primary care provider or the provided orthopedic physician (bone specialist) if you continue to have significant pain in 1 week. In this case you may have a more severe injury that requires further care.   Return instructions:  Please return if your fingers are numb or tingling, appear gray or blue, or you have severe pain  Please return to the Emergency Department if you experience worsening symptoms.  Please return if you have any other emergent concerns. Additional Information:  Your vital signs today were: BP 118/80 (BP Location: Left Arm)    Pulse 82    Temp 98.2 F (36.8 C) (Oral)    Resp 16    Ht 5' 5.25" (1.657 m)    Wt 67.6 kg    SpO2 99%    BMI 24.61 kg/m  If your blood pressure (BP) was elevated above 135/85 this visit, please have this repeated by your doctor within one month. ---------------

## 2020-01-25 NOTE — ED Triage Notes (Signed)
Pt walking this morning and slipped on some wet leaves. Pt fell on pavement, landed on both knees with abrasions per pt.  Pt c/o right wrist pain since fall.  Denies hitting her head.

## 2020-01-25 NOTE — ED Provider Notes (Signed)
[facility] Provider Note   CSN: [id number] Arrival date & time: 01/25/20  1115     History Chief Complaint  Patient presents with  . Wrist Pain    Daisy Wu is a 64 y.o. female with no pertinent past medical history that presents the emergency department today for wrist injury on right side.  Patient states that she was walking this morning and slipped on some wet leaves, landed on both of her knees with [place] injury to the right side.  Admits to some abrasions on her knees, no penetrative injury.  Denies hitting her head, no loss of consciousness.  Denies any blood thinners.  Patient mainly complaining of right-sided wrist pain at this time, no prior injury.  Initially denied any numbness or tingling, states that she is starting to have a little bit of numbness into her pointer finger.  No weakness.  Was able to put home splint on.  Was in her normal health before this.  HPI     Past Medical History:  Diagnosis Date  . Depression   . High cholesterol     Patient Active Problem List   Diagnosis Date Noted  . Hematochezia   . First degree ankle sprain 04/03/2012  . Fracture, ankle 01/16/2012  . Fracture of fibula 12/01/2011  . DEPRESSION/ANXIETY 09/26/2007  . Internal hemorrhoids 09/26/2007  . PNEUMONIA 09/26/2007  . RECTAL BLEEDING 09/26/2007  . HEMATOCHEZIA 09/26/2007  . PERIMENOPAUSAL STATUS 09/26/2007  . ABORTION, SPONTANEOUS 09/26/2007  . BACK PAIN, LUMBAR 09/26/2007  . TOBACCO USE, QUIT 09/26/2007    Past Surgical History:  Procedure Laterality Date  . COLONOSCOPY N/A 02/20/2017   Procedure: COLONOSCOPY;  Surgeon: Danie Binder, MD;  Location: [facility];  Service: Endoscopy;  Laterality: N/A;  9:30am  . COMBINED HYSTEROSCOPY DIAGNOSTIC / D&C    . HEMORRHOID BANDING N/A 02/20/2017   Procedure: Thayer Jew;  Surgeon: Danie Binder, MD;  Location: [facility];  Service: Endoscopy;  Laterality: N/A;  . TONSILLECTOMY    .  TUBAL LIGATION       OB History   No obstetric history on file.     Family History  Problem Relation Age of Onset  . Heart disease Other   . Arthritis Other   . Cancer Other   . Colon cancer Neg Hx   . Colon polyps Neg Hx     Social History   Tobacco Use  . Smoking status: Former Research scientist (life sciences)  . Smokeless tobacco: Never Used  Vaping Use  . Vaping Use: Never used  Substance Use Topics  . Alcohol use: Yes    Comment: bottle of wine every other week  . Drug use: No    Home Medications Prior to Admission medications   Medication Sig Start Date End Date Taking? Authorizing Provider  Calcium Carbonate-Vitamin D (CALCIUM 600 + D PO) Take 1 tablet by mouth daily.    [provider]  cholecalciferol (VITAMIN D) 1000 units tablet Take 1,000 Units by mouth daily.    [provider]  ferrous sulfate 325 (65 FE) MG tablet Take 325 mg by mouth daily.    [provider]  sertraline (ZOLOFT) 100 MG tablet Take 50 mg by mouth every other day.     [provider]    Allergies    Sulfamethoxazole-trimethoprim  Review of Systems   Review of Systems  Constitutional: Negative for diaphoresis, fatigue and fever.  Eyes: Negative for visual disturbance.  Respiratory: Negative  for shortness of breath.   Cardiovascular: Negative for chest pain.  Gastrointestinal: Negative for nausea and vomiting.  Musculoskeletal: Positive for arthralgias. Negative for back pain and myalgias.  Skin: Negative for color change, pallor, rash and wound.  Neurological: Negative for syncope, weakness, light-headedness, numbness and headaches.  Psychiatric/Behavioral: Negative for behavioral problems and confusion.    Physical Exam Updated Vital Signs BP 120/84 (BP Location: Left Arm)   Pulse 74   Temp 98.2 F (36.8 C) (Oral)   Resp 16   Ht 5' 5.25" (1.657 m)   Wt 67.6 kg   SpO2 97%   BMI 24.61 kg/m   Physical Exam Constitutional:      General: She is not in acute  distress.    Appearance: Normal appearance. She is not ill-appearing, toxic-appearing or diaphoretic.  HENT:     Head: Normocephalic and atraumatic.  Eyes:     Extraocular Movements: Extraocular movements intact.     Pupils: Pupils are equal, round, and reactive to light.  Cardiovascular:     Rate and Rhythm: Normal rate and regular rhythm.     Pulses: Normal pulses.  Pulmonary:     Effort: Pulmonary effort is normal. No respiratory distress.     Breath sounds: Normal breath sounds.  Musculoskeletal:        General: Tenderness present. No signs of injury. Normal range of motion.     Cervical back: Normal range of motion.     Comments: Patient with right wrist tenderness to lateral side of wrist, no open fractures.  Patient is able to range wrist in all directions, limited due to pain.  Normal strength and sensation of wrist and fingers.  Normal opposition of thumb.  No snuffbox tenderness.  No discoloration.  Radial pulse 2+, cap refill less than 2 seconds.  Skin:    General: Skin is warm and dry.     Capillary Refill: Capillary refill takes less than 2 seconds.     Comments: Knees without abrasions  Neurological:     General: No focal deficit present.     Mental Status: She is alert and oriented to person, place, and time.  Psychiatric:        Mood and Affect: Mood normal.        Behavior: Behavior normal.        Thought Content: Thought content normal.     ED Results / Procedures / Treatments   Labs (all labs ordered are listed, but only abnormal results are displayed) Labs Reviewed - No data to display  EKG None  Radiology DG Wrist Complete Right  Result Date: 01/25/2020 CLINICAL DATA:  Fall, pain EXAM: RIGHT WRIST - COMPLETE 3+ VIEW COMPARISON:  None. FINDINGS: There is no evidence of fracture or dislocation. There is no evidence of arthropathy or other focal bone abnormality. Incidental note of ulnar minus variant. Soft tissues are unremarkable. IMPRESSION: No fracture  or dislocation of the right wrist. Electronically Signed   By: Eddie Candle M.D.   On: 01/25/2020 12:17    Procedures Procedures (including critical care time)  Medications Ordered in ED Medications - No data to display  ED Course  I have reviewed the triage vital signs and the nursing notes.  Pertinent labs & imaging results that were available during my care of the patient were reviewed by me and considered in my medical decision making (see chart for details).    MDM Rules/Calculators/A&P  ABIA MONACO is a 64 y.o. female with no pertinent past medical history that presents the emergency department today for wrist injury on right side.  [place] injury, patient is distally neurovascularly intact.  No open fractures.  Plain films show no acute fracture dislocation.  Patient to be given wrist brace and follow-up with orthopedic doctor.  RICE therapy discussed, patient to be discharged at this time.  Upon reevaluation, brace in place.  Patient is still distally neurovascularly intact.  Doubt need for further emergent work up at this time. I explained the diagnosis and have given explicit precautions to return to the ER including for any other new or worsening symptoms. The patient understands and accepts the medical plan as it's been dictated and I have answered their questions. Discharge instructions concerning home care and prescriptions have been given. The patient is STABLE and is discharged to home in good condition. Final Clinical Impression(s) / ED Diagnoses Final diagnoses:  Sprain of right wrist, initial encounter    Rx / DC Orders ED Discharge Orders    None       Alfredia Client, PA-C 01/25/20 1256    Hayden Rasmussen, MD 01/25/20 1811

## 2020-03-23 DIAGNOSIS — H25811 Combined forms of age-related cataract, right eye: Secondary | ICD-10-CM | POA: Diagnosis not present

## 2020-03-23 DIAGNOSIS — Z882 Allergy status to sulfonamides status: Secondary | ICD-10-CM | POA: Diagnosis not present

## 2020-03-23 DIAGNOSIS — Z881 Allergy status to other antibiotic agents status: Secondary | ICD-10-CM | POA: Diagnosis not present

## 2020-04-07 DIAGNOSIS — H25812 Combined forms of age-related cataract, left eye: Secondary | ICD-10-CM | POA: Diagnosis not present

## 2020-04-14 DIAGNOSIS — D225 Melanocytic nevi of trunk: Secondary | ICD-10-CM | POA: Diagnosis not present

## 2020-04-14 DIAGNOSIS — Z1283 Encounter for screening for malignant neoplasm of skin: Secondary | ICD-10-CM | POA: Diagnosis not present

## 2020-04-20 DIAGNOSIS — Z882 Allergy status to sulfonamides status: Secondary | ICD-10-CM | POA: Diagnosis not present

## 2020-04-20 DIAGNOSIS — E7800 Pure hypercholesterolemia, unspecified: Secondary | ICD-10-CM | POA: Diagnosis not present

## 2020-04-20 DIAGNOSIS — Z9841 Cataract extraction status, right eye: Secondary | ICD-10-CM | POA: Diagnosis not present

## 2020-04-20 DIAGNOSIS — Z961 Presence of intraocular lens: Secondary | ICD-10-CM | POA: Diagnosis not present

## 2020-04-20 DIAGNOSIS — M8580 Other specified disorders of bone density and structure, unspecified site: Secondary | ICD-10-CM | POA: Diagnosis not present

## 2020-04-20 DIAGNOSIS — Z881 Allergy status to other antibiotic agents status: Secondary | ICD-10-CM | POA: Diagnosis not present

## 2020-04-20 DIAGNOSIS — Z79899 Other long term (current) drug therapy: Secondary | ICD-10-CM | POA: Diagnosis not present

## 2020-04-20 DIAGNOSIS — H2512 Age-related nuclear cataract, left eye: Secondary | ICD-10-CM | POA: Diagnosis not present

## 2020-04-20 DIAGNOSIS — H25811 Combined forms of age-related cataract, right eye: Secondary | ICD-10-CM | POA: Diagnosis not present

## 2020-04-23 DIAGNOSIS — Z79899 Other long term (current) drug therapy: Secondary | ICD-10-CM | POA: Diagnosis not present

## 2020-04-23 DIAGNOSIS — E785 Hyperlipidemia, unspecified: Secondary | ICD-10-CM | POA: Diagnosis not present

## 2020-04-30 DIAGNOSIS — B029 Zoster without complications: Secondary | ICD-10-CM | POA: Diagnosis not present

## 2020-05-07 DIAGNOSIS — B0050 Herpesviral ocular disease, unspecified: Secondary | ICD-10-CM | POA: Diagnosis not present

## 2020-05-07 DIAGNOSIS — E785 Hyperlipidemia, unspecified: Secondary | ICD-10-CM | POA: Diagnosis not present

## 2020-08-18 DIAGNOSIS — H26491 Other secondary cataract, right eye: Secondary | ICD-10-CM | POA: Diagnosis not present

## 2020-10-20 DIAGNOSIS — H35372 Puckering of macula, left eye: Secondary | ICD-10-CM | POA: Diagnosis not present

## 2020-10-20 DIAGNOSIS — Z961 Presence of intraocular lens: Secondary | ICD-10-CM | POA: Insufficient documentation

## 2020-10-28 DIAGNOSIS — M810 Age-related osteoporosis without current pathological fracture: Secondary | ICD-10-CM | POA: Diagnosis not present

## 2020-10-28 DIAGNOSIS — Z52098 Other blood donor, other blood: Secondary | ICD-10-CM | POA: Diagnosis not present

## 2020-10-28 DIAGNOSIS — F419 Anxiety disorder, unspecified: Secondary | ICD-10-CM | POA: Diagnosis not present

## 2020-10-28 DIAGNOSIS — Z79899 Other long term (current) drug therapy: Secondary | ICD-10-CM | POA: Diagnosis not present

## 2020-10-28 DIAGNOSIS — E785 Hyperlipidemia, unspecified: Secondary | ICD-10-CM | POA: Diagnosis not present

## 2020-10-29 DIAGNOSIS — Z961 Presence of intraocular lens: Secondary | ICD-10-CM | POA: Diagnosis not present

## 2020-10-29 DIAGNOSIS — H35373 Puckering of macula, bilateral: Secondary | ICD-10-CM | POA: Diagnosis not present

## 2020-11-03 DIAGNOSIS — E785 Hyperlipidemia, unspecified: Secondary | ICD-10-CM | POA: Diagnosis not present

## 2020-11-03 DIAGNOSIS — F419 Anxiety disorder, unspecified: Secondary | ICD-10-CM | POA: Diagnosis not present

## 2020-11-03 DIAGNOSIS — Z23 Encounter for immunization: Secondary | ICD-10-CM | POA: Diagnosis not present

## 2020-11-03 DIAGNOSIS — Z0000 Encounter for general adult medical examination without abnormal findings: Secondary | ICD-10-CM | POA: Diagnosis not present

## 2020-11-03 DIAGNOSIS — Z6825 Body mass index (BMI) 25.0-25.9, adult: Secondary | ICD-10-CM | POA: Diagnosis not present

## 2020-11-03 DIAGNOSIS — M818 Other osteoporosis without current pathological fracture: Secondary | ICD-10-CM | POA: Diagnosis not present

## 2021-04-13 DIAGNOSIS — F419 Anxiety disorder, unspecified: Secondary | ICD-10-CM | POA: Diagnosis not present

## 2021-04-13 DIAGNOSIS — E785 Hyperlipidemia, unspecified: Secondary | ICD-10-CM | POA: Diagnosis not present

## 2021-04-29 DIAGNOSIS — Z961 Presence of intraocular lens: Secondary | ICD-10-CM | POA: Diagnosis not present

## 2021-04-29 DIAGNOSIS — H35373 Puckering of macula, bilateral: Secondary | ICD-10-CM | POA: Diagnosis not present

## 2021-05-10 DIAGNOSIS — Z1239 Encounter for other screening for malignant neoplasm of breast: Secondary | ICD-10-CM | POA: Diagnosis not present

## 2021-05-10 DIAGNOSIS — Z1231 Encounter for screening mammogram for malignant neoplasm of breast: Secondary | ICD-10-CM | POA: Diagnosis not present

## 2021-10-20 DIAGNOSIS — D2262 Melanocytic nevi of left upper limb, including shoulder: Secondary | ICD-10-CM | POA: Diagnosis not present

## 2021-10-20 DIAGNOSIS — D225 Melanocytic nevi of trunk: Secondary | ICD-10-CM | POA: Diagnosis not present

## 2021-10-20 DIAGNOSIS — L258 Unspecified contact dermatitis due to other agents: Secondary | ICD-10-CM | POA: Diagnosis not present

## 2021-10-20 DIAGNOSIS — Z1283 Encounter for screening for malignant neoplasm of skin: Secondary | ICD-10-CM | POA: Diagnosis not present

## 2021-10-28 DIAGNOSIS — F411 Generalized anxiety disorder: Secondary | ICD-10-CM | POA: Diagnosis not present

## 2021-10-28 DIAGNOSIS — M810 Age-related osteoporosis without current pathological fracture: Secondary | ICD-10-CM | POA: Diagnosis not present

## 2021-10-28 DIAGNOSIS — E785 Hyperlipidemia, unspecified: Secondary | ICD-10-CM | POA: Diagnosis not present

## 2021-11-04 DIAGNOSIS — K219 Gastro-esophageal reflux disease without esophagitis: Secondary | ICD-10-CM | POA: Diagnosis not present

## 2021-11-04 DIAGNOSIS — Z6824 Body mass index (BMI) 24.0-24.9, adult: Secondary | ICD-10-CM | POA: Diagnosis not present

## 2021-11-04 DIAGNOSIS — F411 Generalized anxiety disorder: Secondary | ICD-10-CM | POA: Diagnosis not present

## 2021-11-04 DIAGNOSIS — Z0000 Encounter for general adult medical examination without abnormal findings: Secondary | ICD-10-CM | POA: Diagnosis not present

## 2021-11-04 DIAGNOSIS — M810 Age-related osteoporosis without current pathological fracture: Secondary | ICD-10-CM | POA: Diagnosis not present

## 2021-11-08 ENCOUNTER — Other Ambulatory Visit: Payer: Self-pay | Admitting: *Deleted

## 2021-11-08 DIAGNOSIS — D1721 Benign lipomatous neoplasm of skin and subcutaneous tissue of right arm: Secondary | ICD-10-CM

## 2021-11-18 ENCOUNTER — Ambulatory Visit: Payer: Medicare PPO | Admitting: General Surgery

## 2021-11-18 ENCOUNTER — Encounter: Payer: Self-pay | Admitting: General Surgery

## 2021-11-18 ENCOUNTER — Other Ambulatory Visit: Payer: Self-pay

## 2021-11-18 VITALS — BP 112/76 | HR 74 | Temp 98.1°F | Resp 16 | Ht 65.25 in | Wt 155.0 lb

## 2021-11-18 DIAGNOSIS — D1721 Benign lipomatous neoplasm of skin and subcutaneous tissue of right arm: Secondary | ICD-10-CM

## 2021-11-18 NOTE — Progress Notes (Signed)
Daisy Wu; [id number]; Nov 05, 1955   HPI Patient is a 66 year old white female who was referred to my care by Dr. Allyn Kenner at dermatology for evaluation and treatment of the lipoma over her right shoulder.  She had had a lipoma removed from this area in the remote past and it has recurred.  Is causing her discomfort with movement. Past Medical History:  Diagnosis Date   Depression    High cholesterol     Past Surgical History:  Procedure Laterality Date   COLONOSCOPY N/A 02/20/2017   Procedure: COLONOSCOPY;  Surgeon: Danie Binder, MD;  Location: [facility];  Service: Endoscopy;  Laterality: N/A;  9:30am   COMBINED HYSTEROSCOPY DIAGNOSTIC / D&C     HEMORRHOID BANDING N/A 02/20/2017   Procedure: HEMORRHOID BANDING;  Surgeon: Danie Binder, MD;  [facility]: [facility];  Service: Endoscopy;  Laterality: N/A;   LIPOMA EXCISION Right    R Shoulder   TONSILLECTOMY     TUBAL LIGATION      Family History  Problem Relation Age of Onset   Heart disease Other    Arthritis Other    Cancer Other    Colon cancer Neg Hx    Colon polyps Neg Hx     Current Outpatient Medications on File Prior to Visit  Medication Sig Dispense Refill   augmented betamethasone dipropionate (DIPROLENE-AF) 0.05 % cream SMARTSIG:gel Topical Twice Daily PRN     Calcium Carbonate-Vitamin D (CALCIUM 600 + D PO) Take 1 tablet by mouth daily.     cholecalciferol (VITAMIN D) 1000 units tablet Take 1,000 Units by mouth daily.     ferrous sulfate 325 (65 FE) MG tablet Take 325 mg by mouth daily.     methylcellulose (CITRUCEL) oral powder Take 1 tablet by mouth daily.     pantoprazole (PROTONIX) 40 MG tablet Take 40 mg by mouth daily.     sertraline (ZOLOFT) 50 MG tablet Take 50 mg by mouth daily.     No current facility-administered medications on file prior to visit.    Allergies  Allergen Reactions   Sulfamethoxazole-Trimethoprim Other (See Comments)    REACTION: causes pain in back of neck Other  reaction(s): Other (See Comments) REACTION: causes pain in back of neck Other Reaction: neck pain, Other reaction Other reaction(s): Muscle Pain Other Reaction: neck pain, Other reaction Other reaction(s): Other (See Comments) REACTION: causes pain in back of neck Other Reaction: neck pain, Other reaction REACTION: causes pain in back of neck     Social History   Substance and Sexual Activity  Alcohol Use Yes   Comment: bottle of wine every other week    Social History   Tobacco Use  Smoking Status Former  Smokeless Tobacco Never    Review of Systems  Constitutional: Negative.   HENT: Negative.    Eyes: Negative.   Respiratory: Negative.    Cardiovascular: Negative.   Gastrointestinal: Negative.   Genitourinary: Negative.   Musculoskeletal: Negative.   Skin: Negative.   Neurological: Negative.   Endo/Heme/Allergies: Negative.   Psychiatric/Behavioral: Negative.      Objective   Vitals:   11/18/21 0921  BP: 112/76  Pulse: 74  Resp: 16  Temp: 98.1 F (36.7 C)  SpO2: 95%    Physical Exam Vitals reviewed.  Constitutional:      Appearance: Normal appearance. She is normal weight. She is not ill-appearing.  HENT:     Head: Normocephalic and atraumatic.  Cardiovascular:     Rate and  Rhythm: Normal rate and regular rhythm.     Heart sounds: Normal heart sounds. No murmur heard.    No friction rub. No gallop.  Pulmonary:     Effort: Pulmonary effort is normal. No respiratory distress.     Breath sounds: Normal breath sounds. No stridor. No wheezing, rhonchi or rales.  Musculoskeletal:     Comments: 5 cm ovoid, rubbery, mobile subcutaneous mass noted over the right shoulder.    Skin:    General: Skin is warm and dry.  Neurological:     Mental Status: She is alert and oriented to person, place, and time.     Assessment  Recurrent lipoma, right shoulder Plan  Patient is scheduled for excision of the lipoma, right shoulder on 12/06/2021.  The risks and  benefits of the procedure including bleeding, infection, and recurrence of the lipoma were fully explained to the patient, who gave informed consent.

## 2021-11-18 NOTE — H&P (Signed)
Daisy Wu; [id number]; 03-Jul-1955   HPI Patient is a 66 year old white female who was referred to my care by Dr. Allyn Kenner at dermatology for evaluation and treatment of the lipoma over her right shoulder.  She had had a lipoma removed from this area in the remote past and it has recurred.  Is causing her discomfort with movement. Past Medical History:  Diagnosis Date   Depression    High cholesterol     Past Surgical History:  Procedure Laterality Date   COLONOSCOPY N/A 02/20/2017   Procedure: COLONOSCOPY;  Surgeon: Danie Binder, MD;  [facility]: [facility];  Service: Endoscopy;  Laterality: N/A;  9:30am   COMBINED HYSTEROSCOPY DIAGNOSTIC / D&C     HEMORRHOID BANDING N/A 02/20/2017   Procedure: HEMORRHOID BANDING;  Surgeon: Danie Binder, MD;  [facility]: [facility];  Service: Endoscopy;  Laterality: N/A;   LIPOMA EXCISION Right    R Shoulder   TONSILLECTOMY     TUBAL LIGATION      Family History  Problem Relation Age of Onset   Heart disease Other    Arthritis Other    Cancer Other    Colon cancer Neg Hx    Colon polyps Neg Hx     Current Outpatient Medications on File Prior to Visit  Medication Sig Dispense Refill   augmented betamethasone dipropionate (DIPROLENE-AF) 0.05 % cream SMARTSIG:gel Topical Twice Daily PRN     Calcium Carbonate-Vitamin D (CALCIUM 600 + D PO) Take 1 tablet by mouth daily.     cholecalciferol (VITAMIN D) 1000 units tablet Take 1,000 Units by mouth daily.     ferrous sulfate 325 (65 FE) MG tablet Take 325 mg by mouth daily.     methylcellulose (CITRUCEL) oral powder Take 1 tablet by mouth daily.     pantoprazole (PROTONIX) 40 MG tablet Take 40 mg by mouth daily.     sertraline (ZOLOFT) 50 MG tablet Take 50 mg by mouth daily.     No current facility-administered medications on file prior to visit.    Allergies  Allergen Reactions   Sulfamethoxazole-Trimethoprim Other (See Comments)    REACTION: causes pain in back of neck Other  reaction(s): Other (See Comments) REACTION: causes pain in back of neck Other Reaction: neck pain, Other reaction Other reaction(s): Muscle Pain Other Reaction: neck pain, Other reaction Other reaction(s): Other (See Comments) REACTION: causes pain in back of neck Other Reaction: neck pain, Other reaction REACTION: causes pain in back of neck     Social History   Substance and Sexual Activity  Alcohol Use Yes   Comment: bottle of wine every other week    Social History   Tobacco Use  Smoking Status Former  Smokeless Tobacco Never    Review of Systems  Constitutional: Negative.   HENT: Negative.    Eyes: Negative.   Respiratory: Negative.    Cardiovascular: Negative.   Gastrointestinal: Negative.   Genitourinary: Negative.   Musculoskeletal: Negative.   Skin: Negative.   Neurological: Negative.   Endo/Heme/Allergies: Negative.   Psychiatric/Behavioral: Negative.      Objective   Vitals:   11/18/21 0921  BP: 112/76  Pulse: 74  Resp: 16  Temp: 98.1 F (36.7 C)  SpO2: 95%    Physical Exam Vitals reviewed.  Constitutional:      Appearance: Normal appearance. She is normal weight. She is not ill-appearing.  HENT:     Head: Normocephalic and atraumatic.  Cardiovascular:     Rate and  Rhythm: Normal rate and regular rhythm.     Heart sounds: Normal heart sounds. No murmur heard.    No friction rub. No gallop.  Pulmonary:     Effort: Pulmonary effort is normal. No respiratory distress.     Breath sounds: Normal breath sounds. No stridor. No wheezing, rhonchi or rales.  Musculoskeletal:     Comments: 5 cm ovoid, rubbery, mobile subcutaneous mass noted over the right shoulder.    Skin:    General: Skin is warm and dry.  Neurological:     Mental Status: She is alert and oriented to person, place, and time.     Assessment  Recurrent lipoma, right shoulder Plan  Patient is scheduled for excision of the lipoma, right shoulder on 12/06/2021.  The risks and  benefits of the procedure including bleeding, infection, and recurrence of the lipoma were fully explained to the patient, who gave informed consent.

## 2021-12-02 ENCOUNTER — Encounter (HOSPITAL_COMMUNITY): Payer: Self-pay

## 2021-12-02 ENCOUNTER — Encounter (HOSPITAL_COMMUNITY)
Admission: RE | Admit: 2021-12-02 | Discharge: 2021-12-02 | Disposition: A | Discharge status: Home / Self Care | Payer: Medicare PPO | Source: Ambulatory Visit | Attending: General Surgery | Admitting: General Surgery

## 2021-12-02 HISTORY — DX: Gastro-esophageal reflux disease without esophagitis: K21.9

## 2021-12-06 ENCOUNTER — Encounter (HOSPITAL_COMMUNITY): Payer: Self-pay | Admitting: General Surgery

## 2021-12-06 ENCOUNTER — Ambulatory Visit (HOSPITAL_BASED_OUTPATIENT_CLINIC_OR_DEPARTMENT_OTHER): Payer: Medicare PPO

## 2021-12-06 ENCOUNTER — Encounter (HOSPITAL_COMMUNITY)
Admission: RE | Disposition: A | Discharge status: Home / Self Care | Payer: Self-pay | Source: Ambulatory Visit | Attending: General Surgery

## 2021-12-06 ENCOUNTER — Ambulatory Visit (HOSPITAL_COMMUNITY): Payer: Medicare PPO

## 2021-12-06 ENCOUNTER — Ambulatory Visit (HOSPITAL_COMMUNITY)
Admission: RE | Admit: 2021-12-06 | Discharge: 2021-12-06 | Disposition: A | Discharge status: Home / Self Care | Payer: Medicare PPO | Source: Ambulatory Visit | Attending: General Surgery | Admitting: General Surgery

## 2021-12-06 DIAGNOSIS — Z87891 Personal history of nicotine dependence: Secondary | ICD-10-CM | POA: Insufficient documentation

## 2021-12-06 DIAGNOSIS — D1721 Benign lipomatous neoplasm of skin and subcutaneous tissue of right arm: Secondary | ICD-10-CM | POA: Diagnosis not present

## 2021-12-06 DIAGNOSIS — Z79899 Other long term (current) drug therapy: Secondary | ICD-10-CM | POA: Diagnosis not present

## 2021-12-06 DIAGNOSIS — D171 Benign lipomatous neoplasm of skin and subcutaneous tissue of trunk: Secondary | ICD-10-CM | POA: Diagnosis not present

## 2021-12-06 DIAGNOSIS — F32A Depression, unspecified: Secondary | ICD-10-CM | POA: Diagnosis not present

## 2021-12-06 DIAGNOSIS — K219 Gastro-esophageal reflux disease without esophagitis: Secondary | ICD-10-CM | POA: Insufficient documentation

## 2021-12-06 HISTORY — PX: LIPOMA EXCISION: SHX5283

## 2021-12-06 SURGERY — EXCISION LIPOMA
Anesthesia: General | Site: Shoulder | Laterality: Right

## 2021-12-06 MED ORDER — ONDANSETRON HCL 4 MG/2ML IJ SOLN
INTRAMUSCULAR | Status: DC | PRN
  Administered 2021-12-06: 4 mg via INTRAVENOUS

## 2021-12-06 MED ORDER — ONDANSETRON HCL 4 MG/2ML IJ SOLN: 4.0000 mg | Freq: Once | INTRAMUSCULAR | Status: DC | PRN

## 2021-12-06 MED ORDER — FENTANYL CITRATE (PF) 100 MCG/2ML IJ SOLN
INTRAMUSCULAR | Status: AC
  Filled 2021-12-06: qty 2

## 2021-12-06 MED ORDER — DEXAMETHASONE SODIUM PHOSPHATE 4 MG/ML IJ SOLN
INTRAMUSCULAR | Status: DC | PRN
  Administered 2021-12-06: 4 mg via INTRAVENOUS

## 2021-12-06 MED ORDER — CHLORHEXIDINE GLUCONATE 0.12 % MT SOLN: 15.0000 mL | Freq: Once | OROMUCOSAL | Status: DC

## 2021-12-06 MED ORDER — KETOROLAC TROMETHAMINE 30 MG/ML IJ SOLN: 15.0000 mg | Freq: Once | INTRAMUSCULAR | Status: DC

## 2021-12-06 MED ORDER — LIDOCAINE HCL (CARDIAC) PF 100 MG/5ML IV SOSY
PREFILLED_SYRINGE | INTRAVENOUS | Status: DC | PRN
  Administered 2021-12-06: 60 mg via INTRATRACHEAL

## 2021-12-06 MED ORDER — CHLORHEXIDINE GLUCONATE CLOTH 2 % EX PADS: 6.0000 | MEDICATED_PAD | Freq: Once | CUTANEOUS | Status: DC

## 2021-12-06 MED ORDER — ORAL CARE MOUTH RINSE: 15.0000 mL | Freq: Once | OROMUCOSAL | Status: DC

## 2021-12-06 MED ORDER — PROPOFOL 10 MG/ML IV BOLUS
INTRAVENOUS | Status: DC | PRN
  Administered 2021-12-06: 200 mg via INTRAVENOUS

## 2021-12-06 MED ORDER — 0.9 % SODIUM CHLORIDE (POUR BTL) OPTIME
TOPICAL | Status: DC | PRN
  Administered 2021-12-06: 1000 mL

## 2021-12-06 MED ORDER — FENTANYL CITRATE (PF) 100 MCG/2ML IJ SOLN
INTRAMUSCULAR | Status: DC | PRN
  Administered 2021-12-06: 25 ug via INTRAVENOUS

## 2021-12-06 MED ORDER — CHLORHEXIDINE GLUCONATE 0.12 % MT SOLN
OROMUCOSAL | Status: AC
  Administered 2021-12-06: 15 mL
  Filled 2021-12-06: qty 15

## 2021-12-06 MED ORDER — LACTATED RINGERS IV SOLN: INTRAVENOUS | Status: DC

## 2021-12-06 MED ORDER — FENTANYL CITRATE PF 50 MCG/ML IJ SOSY: 25.0000 ug | PREFILLED_SYRINGE | INTRAMUSCULAR | Status: DC | PRN

## 2021-12-06 MED ORDER — HYDROCODONE-ACETAMINOPHEN 5-325 MG PO TABS: 1.0000 | ORAL_TABLET | ORAL | 0 refills | Status: DC | PRN

## 2021-12-06 MED ORDER — PROPOFOL 10 MG/ML IV BOLUS
INTRAVENOUS | Status: AC
  Filled 2021-12-06: qty 20

## 2021-12-06 MED ORDER — BUPIVACAINE-EPINEPHRINE (PF) 0.5% -1:200000 IJ SOLN
INTRAMUSCULAR | Status: DC | PRN
  Administered 2021-12-06: 5 mL

## 2021-12-06 MED ORDER — LIDOCAINE HCL (PF) 1 % IJ SOLN
INTRAMUSCULAR | Status: AC
  Filled 2021-12-06: qty 30

## 2021-12-06 MED ORDER — OXYCODONE HCL 5 MG PO TABS: 5.0000 mg | ORAL_TABLET | Freq: Once | ORAL | Status: DC | PRN

## 2021-12-06 MED ORDER — BUPIVACAINE-EPINEPHRINE (PF) 0.5% -1:200000 IJ SOLN
INTRAMUSCULAR | Status: AC
  Filled 2021-12-06: qty 30

## 2021-12-06 MED ORDER — OXYCODONE HCL 5 MG/5ML PO SOLN: 5.0000 mg | Freq: Once | ORAL | Status: DC | PRN

## 2021-12-06 SURGICAL SUPPLY — 33 items
ADH SKN CLS APL DERMABOND .7 (GAUZE/BANDAGES/DRESSINGS)
ADH SKN CLS LQ APL DERMABOND (GAUZE/BANDAGES/DRESSINGS) ×1
APL PRP STRL LF DISP 70% ISPRP (MISCELLANEOUS) ×1
CHLORAPREP W/TINT 26 (MISCELLANEOUS) ×1 IMPLANT
CLOTH BEACON ORANGE TIMEOUT ST (SAFETY) ×1 IMPLANT
COVER LIGHT HANDLE STERIS (MISCELLANEOUS) ×2 IMPLANT
DECANTER SPIKE VIAL GLASS SM (MISCELLANEOUS) ×1 IMPLANT
DERMABOND ADVANCED .7 DNX12 (GAUZE/BANDAGES/DRESSINGS) IMPLANT
DERMABOND ADVANCED .7 DNX6 (GAUZE/BANDAGES/DRESSINGS) IMPLANT
ELECT NDL TIP 2.8 STRL (NEEDLE) IMPLANT
ELECT NEEDLE TIP 2.8 STRL (NEEDLE) IMPLANT
ELECT REM PT RETURN 9FT ADLT (ELECTROSURGICAL) ×1
ELECTRODE REM PT RTRN 9FT ADLT (ELECTROSURGICAL) ×1 IMPLANT
GLOVE BIOGEL PI IND STRL 6.5 (GLOVE) IMPLANT
GLOVE BIOGEL PI IND STRL 7.0 (GLOVE) ×2 IMPLANT
GLOVE SS BIOGEL STRL SZ 6.5 (GLOVE) IMPLANT
GLOVE SURG SS PI 7.5 STRL IVOR (GLOVE) ×1 IMPLANT
GOWN STRL REUS W/TWL LRG LVL3 (GOWN DISPOSABLE) ×2 IMPLANT
KIT TURNOVER KIT A (KITS) ×1 IMPLANT
MANIFOLD NEPTUNE II (INSTRUMENTS) ×1 IMPLANT
NDL HYPO 25X1 1.5 SAFETY (NEEDLE) ×1 IMPLANT
NEEDLE HYPO 25X1 1.5 SAFETY (NEEDLE) ×1 IMPLANT
NS IRRIG 1000ML POUR BTL (IV SOLUTION) ×1 IMPLANT
PACK MINOR (CUSTOM PROCEDURE TRAY) IMPLANT
PAD ARMBOARD 7.5X6 YLW CONV (MISCELLANEOUS) ×1 IMPLANT
SET BASIN LINEN APH (SET/KITS/TRAYS/PACK) ×1 IMPLANT
SUT ETHILON 3 0 FSL (SUTURE) IMPLANT
SUT MNCRL AB 4-0 PS2 18 (SUTURE) IMPLANT
SUT PROLENE 4 0 PS 2 18 (SUTURE) IMPLANT
SUT VIC AB 3-0 SH 27 (SUTURE) ×1
SUT VIC AB 3-0 SH 27X BRD (SUTURE) IMPLANT
SYR CONTROL 10ML LL (SYRINGE) ×1 IMPLANT
TOWEL OR 17X26 4PK STRL BLUE (TOWEL DISPOSABLE) ×1 IMPLANT

## 2021-12-06 NOTE — Transfer of Care (Signed)
Immediate Anesthesia Transfer of Care Note  Patient: Daisy Wu  Procedure(s) Performed: EXCISION LIPOMA, SHOULDER (Right: Shoulder)  Patient Location: PACU  Anesthesia Type:General  Level of Consciousness: awake and alert   Airway & Oxygen Therapy: Patient Spontanous Breathing  Post-op Assessment: Report given to RN and Post -op Vital signs reviewed and stable  Post vital signs: Reviewed and stable  Last Vitals:  Vitals Value Taken Time  BP 123/77 12/06/21 1000  Temp 36.5 12/06/21 10000  Pulse 75 12/06/21 1003  Resp 10 12/06/21 1003  SpO2 97 % 12/06/21 1003  Vitals shown include unvalidated device data.  Last Pain:  Vitals:   12/06/21 0848  PainSc: 0-No pain         Complications: No notable events documented.

## 2021-12-06 NOTE — Op Note (Signed)
Patient:  Daisy Wu  DOB:  February 21, 1955  MRN:  [id number]   Preop Diagnosis: Lipoma, right shoulder  Postop Diagnosis: Same  Procedure: Excision of lipoma, right shoulder  Surgeon: Aviva Signs, MD  Anes: General  Indications: Patient is a 66 year old white female who presents with a recurrent lipoma over her right shoulder.  The risks and benefits of the procedure including bleeding, infection, and recurrence of the lipoma were fully explained to the patient, who gave informed consent.  Procedure note: The patient was placed in the supine position.  After general anesthesia was administered, the right shoulder was prepped and draped using usual sterile technique with ChloraPrep.  Surgical site confirmation was performed.  The patient had a greater than 5 cm lipoma along the posterior aspect of the right shoulder.  A longitudinal incision was made over the lipoma.  The lipoma was excised without difficulty.  A bleeding was controlled using Bovie electrocautery.  1/2% Sensorcaine with epi was instilled into the surrounding wound.  The subcutaneous layer was reapproximated using a 3-0 Vicryl interrupted suture.  Skin was closed using a 4-0 Monocryl subcuticular suture.  Dermabond was applied.  All tape and needle counts were correct at the end of the procedure.  The patient was awakened and transferred to PACU in stable condition.  Complications: None  EBL: Minimal  Specimen: Lipoma

## 2021-12-06 NOTE — Anesthesia Postprocedure Evaluation (Signed)
Anesthesia Post Note  Patient: Daisy Wu  Procedure(s) Performed: EXCISION LIPOMA, SHOULDER (Right: Shoulder)  Patient location during evaluation: Phase II Anesthesia Type: General Level of consciousness: awake Pain management: pain level controlled Vital Signs Assessment: post-procedure vital signs reviewed and stable Respiratory status: spontaneous breathing and respiratory function stable Cardiovascular status: blood pressure returned to baseline and stable Postop Assessment: no headache and no apparent nausea or vomiting Anesthetic complications: no Comments: Late entry   No notable events documented.   Last Vitals:  Vitals:   12/06/21 1031 12/06/21 1036  BP:  105/68  Pulse:  66  Resp:  12  Temp:  36.6 C  SpO2: 96% 96%    Last Pain:  Vitals:   12/06/21 1036  TempSrc: Oral  PainSc: 0-No pain                 Louann Sjogren

## 2021-12-06 NOTE — Anesthesia Procedure Notes (Signed)
Procedure Name: LMA Insertion Date/Time: 12/06/2021 9:31 AM  Performed by: Camillia Herter, RNPre-anesthesia Checklist: Patient identified, Emergency Drugs available, Suction available and Patient being monitored Patient Re-evaluated:Patient Re-evaluated prior to induction Oxygen Delivery Method: Circle system utilized Preoxygenation: Pre-oxygenation with 100% oxygen Induction Type: IV induction Ventilation: Mask ventilation without difficulty LMA: LMA inserted LMA Size: 4.0 Number of attempts: 1 Placement Confirmation: positive ETCO2 and breath sounds checked- equal and bilateral Tube secured with: Tape Dental Injury: Teeth and Oropharynx as per pre-operative assessment

## 2021-12-06 NOTE — Anesthesia Preprocedure Evaluation (Signed)
Anesthesia Evaluation  Patient identified by MRN, date of birth, ID band Patient awake    Reviewed: Allergy & Precautions, H&P , NPO status , Patient's Chart, lab work & pertinent test results, reviewed documented beta blocker date and time   Airway Mallampati: II  TM Distance: >3 FB Neck ROM: full    Dental no notable dental hx.    Pulmonary neg pulmonary ROS, former smoker,    Pulmonary exam normal breath sounds clear to auscultation       Cardiovascular Exercise Tolerance: Good negative cardio ROS   Rhythm:regular Rate:Normal     Neuro/Psych PSYCHIATRIC DISORDERS Depression negative neurological ROS     GI/Hepatic Neg liver ROS, GERD  Medicated,  Endo/Other  negative endocrine ROS  Renal/GU negative Renal ROS  negative genitourinary   Musculoskeletal   Abdominal   Peds  Hematology negative hematology ROS (+)   Anesthesia Other Findings   Reproductive/Obstetrics negative OB ROS                             Anesthesia Physical Anesthesia Plan  ASA: 2  Anesthesia Plan: General and General LMA   Post-op Pain Management:    Induction:   PONV Risk Score and Plan: Ondansetron  Airway Management Planned:   Additional Equipment:   Intra-op Plan:   Post-operative Plan:   Informed Consent: I have reviewed the patients History and Physical, chart, labs and discussed the procedure including the risks, benefits and alternatives for the proposed anesthesia with the patient or authorized representative who has indicated his/her understanding and acceptance.     Dental Advisory Given  Plan Discussed with: CRNA  Anesthesia Plan Comments:         Anesthesia Quick Evaluation

## 2021-12-06 NOTE — Interval H&P Note (Signed)
History and Physical Interval Note:  12/06/2021 9:12 AM  Daisy Wu  has presented today for surgery, with the diagnosis of LIPOMA RIGHT SHOULDER 5 CM.  The various methods of treatment have been discussed with the patient and family. After consideration of risks, benefits and other options for treatment, the patient has consented to  Procedure(s): EXCISION LIPOMA, SHOULDER (Right) as a surgical intervention.  The patient's history has been reviewed, patient examined, no change in status, stable for surgery.  I have reviewed the patient's chart and labs.  Questions were answered to the patient's satisfaction.     Aviva Signs

## 2021-12-07 LAB — SURGICAL PATHOLOGY

## 2021-12-09 ENCOUNTER — Encounter (HOSPITAL_COMMUNITY): Payer: Self-pay | Admitting: General Surgery

## 2021-12-17 ENCOUNTER — Ambulatory Visit (INDEPENDENT_AMBULATORY_CARE_PROVIDER_SITE_OTHER): Payer: Medicare PPO | Admitting: General Surgery

## 2021-12-17 DIAGNOSIS — Z09 Encounter for follow-up examination after completed treatment for conditions other than malignant neoplasm: Secondary | ICD-10-CM

## 2021-12-17 NOTE — Progress Notes (Signed)
Subjective:     Daisy Wu  Virtual telephone visit performed with patient.  Patient is doing well.  She has no complaints. Objective:    There were no vitals taken for this visit.  General:  alert, cooperative, and by phone  Final pathology results given to patient.  Lipoma was found.     Assessment:    Doing well postoperatively.    Plan:   May increase activity as able.  Follow-up with me as needed.  Total telephone time was 1-1/2 minutes.  As this was a part of the global surgical fee, this was not a billable visit.

## 2022-01-07 DIAGNOSIS — B029 Zoster without complications: Secondary | ICD-10-CM | POA: Diagnosis not present

## 2022-02-09 DIAGNOSIS — E785 Hyperlipidemia, unspecified: Secondary | ICD-10-CM | POA: Diagnosis not present

## 2022-02-09 DIAGNOSIS — K219 Gastro-esophageal reflux disease without esophagitis: Secondary | ICD-10-CM | POA: Diagnosis not present

## 2022-03-21 ENCOUNTER — Ambulatory Visit (HOSPITAL_BASED_OUTPATIENT_CLINIC_OR_DEPARTMENT_OTHER): Payer: Medicare PPO | Admitting: Internal Medicine

## 2022-03-21 ENCOUNTER — Encounter (HOSPITAL_BASED_OUTPATIENT_CLINIC_OR_DEPARTMENT_OTHER): Payer: Self-pay | Admitting: Internal Medicine

## 2022-03-21 ENCOUNTER — Telehealth: Payer: Self-pay | Admitting: Internal Medicine

## 2022-03-21 VITALS — BP 110/76 | HR 94 | Ht 65.0 in | Wt 160.2 lb

## 2022-03-21 DIAGNOSIS — E7801 Familial hypercholesterolemia: Secondary | ICD-10-CM

## 2022-03-21 DIAGNOSIS — T466X5A Adverse effect of antihyperlipidemic and antiarteriosclerotic drugs, initial encounter: Secondary | ICD-10-CM | POA: Diagnosis not present

## 2022-03-21 DIAGNOSIS — E782 Mixed hyperlipidemia: Secondary | ICD-10-CM | POA: Diagnosis not present

## 2022-03-21 DIAGNOSIS — M791 Myalgia, unspecified site: Secondary | ICD-10-CM

## 2022-03-21 MED ORDER — REPATHA SURECLICK 140 MG/ML ~~LOC~~ SOAJ: 1.0000 mL | SUBCUTANEOUS | 3 refills | Status: DC

## 2022-03-21 NOTE — Patient Instructions (Signed)
Medication Instructions:  Dr. Debara Pickett recommends Repatha Sureclick '140mg'$ /mL (PCSK9). This is an injectable cholesterol medication self-administered once every 14 days. This medication will likely need prior approval with your insurance company, which we will work on. If the medication is not approved initially, we may need to do an appeal with your insurance.   Administer medication in area of fatty tissue such as abdomen, outer thigh, back of upper arm - and rotate site with each injection Store medication in refrigerator until ready to administer - allow to sit at room temp for 30 mins - 1 hour prior to injection Dispose of medication in a SHARPS container - your pharmacy should be able to direct you on this and proper disposal   If you need a co-pay card for Repatha: MicroLists.at If you need a co-pay card for Praluent: [email or web address]  Patient Assistance:    These foundations have funds at various times.   The PAN Foundation: https://www.panfoundation.org/disease-funds/hypercholesterolemia/ -- can sign up for wait list  The [facility] offers assistance to help pay for medication copays.  They will cover copays for all cholesterol lowering meds, including statins, fibrates, omega-3 fish oils like Vascepa, ezetimibe, Repatha, Praluent, Nexletol, Nexlizet.  The cards are usually good for $2,500 or 12 months, whichever comes first. Go to healthwellfoundation.org Click on "Apply Now" Answer questions as to whom is applying (patient or representative) Your disease fund will be "hypercholesterolemia - Medicare access" They will ask questions about finances and which medications you are taking for cholesterol When you submit, the approval is usually within minutes.  You will need to print the card information from the site You will need to show this information to your pharmacy, they will bill your Medicare Part D plan first -then bill  Health Well --for the copay.   You can also call them at [phone], although the hold times can be quite long.     *If you need a refill on your cardiac medications before your next appointment, please call your pharmacy*   Lab Work: FASTING lab work to check cholesterol in 3-4 months  -- complete about 1 week before your next visit with Dr. Debara Pickett   If you have labs (blood work) drawn today and your tests are completely normal, you will receive your results only by: [place] (if you have MyChart) OR A paper copy in the mail If you have any lab test that is abnormal or we need to change your treatment, we will call you to review the results.   Testing/Procedures: NONE   Follow-Up: At [facility], you and your health needs are our priority.  As part of our continuing mission to provide you with exceptional heart care, we have created designated Provider Care Teams.  These Care Teams include your primary Cardiologist (physician) and Advanced Practice Providers (APPs -  Physician Assistants and Nurse Practitioners) who all work together to provide you with the care you need, when you need it.  We recommend signing up for the patient portal called "MyChart".  Sign up information is provided on this After Visit Summary.  MyChart is used to connect with patients for Virtual Visits (Telemedicine).  Patients are able to view lab/test results, encounter notes, upcoming appointments, etc.  Non-urgent messages can be sent to your provider as well.   To learn more about what you can do with MyChart, go to [email or web address].    Your next appointment:    3-4 months  Provider:   Lyman Bishop MD -  lipid clinic Other Instructions

## 2022-03-21 NOTE — Progress Notes (Signed)
LIPID CLINIC CONSULT NOTE  Chief Complaint:  Manage dyslipidemia  Primary Care Physician: Daisy Noble, MD  Primary Cardiologist:  None  HPI:  Daisy Wu is a 67 y.o. female who is being seen today for the evaluation of dyslipidemia at the request of Daisy Noble, MD. This is a pleasant 67 year old female kindly referred for evaluation management of dyslipidemia.  She reports a history of high cholesterol in the past and brought in labs dating back to 2016.  There is been a linear increase in cholesterol from 93 before.  Her triglycerides are also increased from 135 up to more recently 402.  HDL 39 and LDL 202 based on labs in September 2023.  She has also been on statins in the past tried atorvastatin and pravastatin both of which caused significant myalgias.  She is not currently on therapy.  It was noted that there is remote heart disease in the family.  Her findings are concerning for a familial hyperlipidemia.  PMHx:  Past Medical History:  Diagnosis Date   Depression    GERD (gastroesophageal reflux disease)    High cholesterol     Past Surgical History:  Procedure Laterality Date   COLONOSCOPY N/A 02/20/2017   Procedure: COLONOSCOPY;  Surgeon: Daisy Binder, MD;  [facility];  Service: Endoscopy;  Laterality: N/A;  9:30am   COMBINED HYSTEROSCOPY DIAGNOSTIC / D&C     HEMORRHOID BANDING N/A 02/20/2017   Procedure: HEMORRHOID BANDING;  Surgeon: Daisy Binder, MD;  [facility];  Service: Endoscopy;  Laterality: N/A;   LIPOMA EXCISION Right    R Shoulder   LIPOMA EXCISION Right 12/06/2021   Procedure: EXCISION LIPOMA, SHOULDER;  Surgeon: Daisy Signs, MD;  Location: [facility];  Service: General;  Laterality: Right;   TONSILLECTOMY     TUBAL LIGATION      FAMHx:  Family History  Problem Relation Age of Onset   Heart disease Other    Arthritis Other    Cancer Other    Colon cancer Neg Hx    Colon polyps Neg Hx     SOCHx:   reports  that she has quit smoking. She has never used smokeless tobacco. She reports current alcohol use. She reports that she does not use drugs.  ALLERGIES:  Allergies  Allergen Reactions   Bactrim [Sulfamethoxazole-Trimethoprim] Other (See Comments)    causes pain in back of neck   Atorvastatin Other (See Comments)    myalgias   Pravastatin Other (See Comments)    myalgias    ROS: Pertinent items noted in HPI and remainder of comprehensive ROS otherwise negative.  HOME MEDS: Current Outpatient Medications on File Prior to Visit  Medication Sig Dispense Refill   Calcium Carb-Cholecalciferol (CALCIUM+D3 PO) Take 1 tablet by mouth daily with lunch.     Calcium Carbonate-Vit D-Min (CALCIUM 1200) 1200-1000 MG-UNIT CHEW Chew 1 tablet by mouth daily.     Cholecalciferol (VITAMIN D) 50 MCG (2000 UT) tablet Take 2,000 Units by mouth in the morning.     ferrous sulfate 325 (65 FE) MG tablet Take 325 mg by mouth in the morning.     Methylcellulose, Laxative, (CITRUCEL PO) Take 2 tablets by mouth in the morning.     Omega-3 Fatty Acids (FISH OIL TRIPLE STRENGTH PO) Take 2 tablets by mouth daily. With co q 10     pantoprazole (PROTONIX) 40 MG tablet Take 40 mg by mouth daily before breakfast.     sertraline (ZOLOFT)  50 MG tablet Take 50 mg by mouth in the morning.     No current facility-administered medications on file prior to visit.    LABS/IMAGING: No results found for this or any previous visit (from the past 48 hour(s)). No results found.  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   Ms. 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  We discussed the possibility of genetic testing today however she did not seem interested in this.  It is also less likely to be covered by Medicare.  As she has had statin intolerance, it is reasonable to consider alternative therapies with her high LDL and I think she is a good candidate for PCSK9 inhibitor therapy.  We will pursue antibody PCSK9 inhibitor therapies.  She may also need treatment for her high triglycerides as this is not significantly affected by the PCSK9 condition.  In addition she should continue to work with diet which sounds like could improve with regards to lowering sugars and saturated fats as well as increasing exercise.  Will plan repeat lipids including NMR and LP(a) in about 3 to 4 months and follow-up with me afterwards.  Thanks again for the kind referral.  Daisy Casino, MD, FACC, [place] Director of the Advanced Lipid Disorders &  Cardiovascular Risk Reduction Clinic Diplomate of the American Board of Clinical Lipidology Attending Cardiologist  Direct Dial: [phone]  Fax: [phone]  Website:  [email or web address] 03/21/2022, 11:59 AM

## 2022-03-21 NOTE — Telephone Encounter (Signed)
PA for repatha sureclick submitted via [facility]  PA Case: [id number], Status: Approved, Coverage Starts on: 02/14/2022 12:00:00 AM, Coverage Ends on: 02/14/2023 12:00:00 AM.

## 2022-03-23 ENCOUNTER — Encounter (HOSPITAL_BASED_OUTPATIENT_CLINIC_OR_DEPARTMENT_OTHER): Payer: Self-pay | Admitting: Internal Medicine

## 2022-03-23 NOTE — Telephone Encounter (Signed)
This has been addressed in another encounter. Reply sent to patient

## 2022-03-30 ENCOUNTER — Ambulatory Visit: Payer: Medicare PPO

## 2022-04-06 ENCOUNTER — Ambulatory Visit: Payer: Medicare PPO | Attending: Cardiology | Admitting: *Deleted

## 2022-04-06 DIAGNOSIS — E7801 Familial hypercholesterolemia: Secondary | ICD-10-CM

## 2022-04-06 NOTE — Progress Notes (Signed)
Patient presents today for a nurse visit for genetic test. Patient educated on test process, how and when results will be received, and billing ($299 if not covered by insurance). She was given info sheet on genes tested.   Buccal swab performed. Patient signed order form - pending MD signature on 04/07/22.   Genetic test for dyslipidemia/ASCVD ordered ([facility]) ID: [id number]

## 2022-05-01 ENCOUNTER — Encounter (HOSPITAL_BASED_OUTPATIENT_CLINIC_OR_DEPARTMENT_OTHER): Payer: Self-pay | Admitting: Internal Medicine

## 2022-05-10 ENCOUNTER — Encounter: Payer: Self-pay | Admitting: Internal Medicine

## 2022-06-08 DIAGNOSIS — K219 Gastro-esophageal reflux disease without esophagitis: Secondary | ICD-10-CM | POA: Diagnosis not present

## 2022-06-08 DIAGNOSIS — E785 Hyperlipidemia, unspecified: Secondary | ICD-10-CM | POA: Diagnosis not present

## 2022-06-20 DIAGNOSIS — Z1231 Encounter for screening mammogram for malignant neoplasm of breast: Secondary | ICD-10-CM | POA: Diagnosis not present

## 2022-06-20 DIAGNOSIS — R92333 Mammographic heterogeneous density, bilateral breasts: Secondary | ICD-10-CM | POA: Diagnosis not present

## 2022-07-29 DIAGNOSIS — N952 Postmenopausal atrophic vaginitis: Secondary | ICD-10-CM | POA: Diagnosis not present

## 2022-07-29 DIAGNOSIS — R102 Pelvic and perineal pain: Secondary | ICD-10-CM | POA: Diagnosis not present

## 2022-07-29 DIAGNOSIS — N898 Other specified noninflammatory disorders of vagina: Secondary | ICD-10-CM | POA: Diagnosis not present

## 2022-07-29 DIAGNOSIS — K649 Unspecified hemorrhoids: Secondary | ICD-10-CM | POA: Diagnosis not present

## 2022-08-03 DIAGNOSIS — E7801 Familial hypercholesterolemia: Secondary | ICD-10-CM | POA: Diagnosis not present

## 2022-08-04 LAB — NMR, LIPOPROFILE
Cholesterol, Total: 149 mg/dL (ref 100–199)
HDL Particle Number: 29.2 umol/L — ABNORMAL LOW (ref >=30.5)
HDL-C: 41 mg/dL (ref >39)
LDL Particle Number: 837 nmol/L (ref <1000)
LDL Size: 20.4 nm — ABNORMAL LOW (ref >20.5)
LDL-C (NIH Calc): 75 mg/dL (ref 0–99)
LP-IR Score: 55 — ABNORMAL HIGH (ref <=45)
Small LDL Particle Number: 407 nmol/L (ref <=527)
Triglycerides: 199 mg/dL — ABNORMAL HIGH (ref 0–149)

## 2022-08-04 LAB — LIPOPROTEIN A (LPA): Lipoprotein (a): 231.6 nmol/L — ABNORMAL HIGH (ref <75.0)

## 2022-08-16 ENCOUNTER — Ambulatory Visit (HOSPITAL_BASED_OUTPATIENT_CLINIC_OR_DEPARTMENT_OTHER): Payer: Medicare PPO | Admitting: Internal Medicine

## 2022-08-16 ENCOUNTER — Encounter (HOSPITAL_BASED_OUTPATIENT_CLINIC_OR_DEPARTMENT_OTHER): Payer: Self-pay | Admitting: Internal Medicine

## 2022-08-16 VITALS — BP 116/77 | HR 86 | Ht 65.0 in | Wt 147.2 lb

## 2022-08-16 DIAGNOSIS — T466X5D Adverse effect of antihyperlipidemic and antiarteriosclerotic drugs, subsequent encounter: Secondary | ICD-10-CM

## 2022-08-16 DIAGNOSIS — E7841 Elevated Lipoprotein(a): Secondary | ICD-10-CM | POA: Diagnosis not present

## 2022-08-16 DIAGNOSIS — E7801 Familial hypercholesterolemia: Secondary | ICD-10-CM | POA: Diagnosis not present

## 2022-08-16 DIAGNOSIS — M791 Myalgia, unspecified site: Secondary | ICD-10-CM | POA: Diagnosis not present

## 2022-08-16 DIAGNOSIS — E782 Mixed hyperlipidemia: Secondary | ICD-10-CM

## 2022-08-16 NOTE — Patient Instructions (Addendum)

## 2022-08-16 NOTE — Progress Notes (Signed)
LIPID CLINIC CONSULT NOTE  Chief Complaint:  Manage dyslipidemia  Primary Care Physician: Carylon Perches, MD  Primary Cardiologist:  None  HPI:  Daisy Wu is a 67 y.o. female who is being seen today for the evaluation of dyslipidemia at the request of Carylon Perches, MD. This is a pleasant 67 year old female kindly referred for evaluation management of dyslipidemia.  She reports a history of high cholesterol in the past and brought in labs dating back to 2016.  There is been a linear increase in cholesterol from 93 before.  Her triglycerides are also increased from 135 up to more recently 402.  HDL 39 and LDL 202 based on labs in September 2023.  She has also been on statins in the past tried atorvastatin and pravastatin both of which caused significant myalgias.  She is not currently on therapy.  It was noted that there is remote heart disease in the family.  Her findings are concerning for a familial hyperlipidemia.  08/16/2022  Daisy Wu returns today for follow-up.  She seems to be tolerating Repatha well without any significant issues.  She is also made significant dietary changes and started exercising more.  She has had a robust response to therapy.  Her LDL particle number is now down to 837 with an LDL-C of 75 (down from 202), HDL 41 and triglycerides 199 (down from 402).  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  Additionally, she had an E2/E 3 variant which predicts a higher cholesterol levels.  PMHx:  Past Medical History:  Diagnosis Date   Depression    GERD (gastroesophageal reflux disease)    High cholesterol     Past Surgical History:  Procedure Laterality Date   COLONOSCOPY N/A 02/20/2017   Procedure: COLONOSCOPY;  Surgeon: West Bali, MD;  [facility];  Service: Endoscopy;  Laterality:  N/A;  9:30am   COMBINED HYSTEROSCOPY DIAGNOSTIC / D&C     HEMORRHOID BANDING N/A 02/20/2017   Procedure: HEMORRHOID BANDING;  Surgeon: West Bali, MD;  [facility];  Service: Endoscopy;  Laterality: N/A;   LIPOMA EXCISION Right    R Shoulder   LIPOMA EXCISION Right 12/06/2021   Procedure: EXCISION LIPOMA, SHOULDER;  Surgeon: Franky Macho, MD;  Location: [facility];  Service: General;  Laterality: Right;   TONSILLECTOMY     TUBAL LIGATION      FAMHx:  Family History  Problem Relation Age of Onset   Heart disease Other    Arthritis Other    Cancer Other    Colon cancer Neg Hx    Colon polyps Neg Hx     SOCHx:   reports that she has quit smoking. She has never used smokeless tobacco. She reports current alcohol use. She reports that she does not use drugs.  ALLERGIES:  Allergies  Allergen Reactions   Bactrim [Sulfamethoxazole-Trimethoprim] Other (See Comments)    causes pain in back of neck   Atorvastatin Other (See Comments)    myalgias   Pravastatin Other (See Comments)    myalgias    ROS: Pertinent items noted in HPI and remainder of comprehensive ROS otherwise negative.  HOME MEDS: Current Outpatient Medications on File Prior to Visit  Medication Sig Dispense Refill   Calcium Carbonate-Vit D-Min (CALCIUM 1200) 1200-1000 MG-UNIT CHEW Chew 1 tablet  by mouth daily.     Cholecalciferol (VITAMIN D) 50 MCG (2000 UT) tablet Take 2,000 Units by mouth in the morning.     Evolocumab (REPATHA SURECLICK) 140 MG/ML SOAJ Inject 140 mg into the skin every 14 (fourteen) days. 6 mL 3   ferrous sulfate 325 (65 FE) MG tablet Take 325 mg by mouth in the morning.     Methylcellulose, Laxative, (CITRUCEL PO) Take 2 tablets by mouth in the morning.     Omega-3 Fatty Acids (FISH OIL TRIPLE STRENGTH PO) Take 2 tablets by mouth daily. With co q 10     pantoprazole (PROTONIX) 40 MG tablet Take 40 mg by mouth daily before breakfast.     sertraline (ZOLOFT) 50 MG tablet Take 50 mg  by mouth in the morning.     No current facility-administered medications on file prior to visit.    LABS/IMAGING: No results found for this or any previous visit (from the past 48 hour(s)). No results found.  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   Ms. [name] has had an excellent response to Repatha therapy but still has an elevated LP(a) of 231.6 nmol/L.  Her LDL is near target less than 70 with a small LDL particle number and total particle number that are well treated.  She is going to try to increase her exercise even further and I think this will only help her numbers.  I would advise that we continue with our current therapy and plan repeat lipids in about 6 months.  Follow-up with me at that time.  Chrystie Nose, MD, [facility], FACP  [place]  [facility]  Medical Director of the Advanced Lipid Disorders &  Cardiovascular Risk Reduction Clinic Diplomate of the American Board of Clinical Lipidology Attending Cardiologist  Direct Dial: [phone]  Fax: [phone]  Website:  [email or web address] 08/16/2022, 8:42 AM

## 2022-11-04 DIAGNOSIS — F419 Anxiety disorder, unspecified: Secondary | ICD-10-CM | POA: Diagnosis not present

## 2022-11-04 DIAGNOSIS — K219 Gastro-esophageal reflux disease without esophagitis: Secondary | ICD-10-CM | POA: Diagnosis not present

## 2022-11-04 DIAGNOSIS — Z79899 Other long term (current) drug therapy: Secondary | ICD-10-CM | POA: Diagnosis not present

## 2022-11-04 DIAGNOSIS — M810 Age-related osteoporosis without current pathological fracture: Secondary | ICD-10-CM | POA: Diagnosis not present

## 2022-11-04 DIAGNOSIS — E785 Hyperlipidemia, unspecified: Secondary | ICD-10-CM | POA: Diagnosis not present

## 2022-11-12 IMAGING — DX DG WRIST COMPLETE 3+V*R*
3 series · 3 of 3 positions shown · non-contrast
Comparison: None.

CLINICAL DATA: Fall, pain

EXAM:
RIGHT WRIST - COMPLETE 3+ VIEW

[wrist ap]
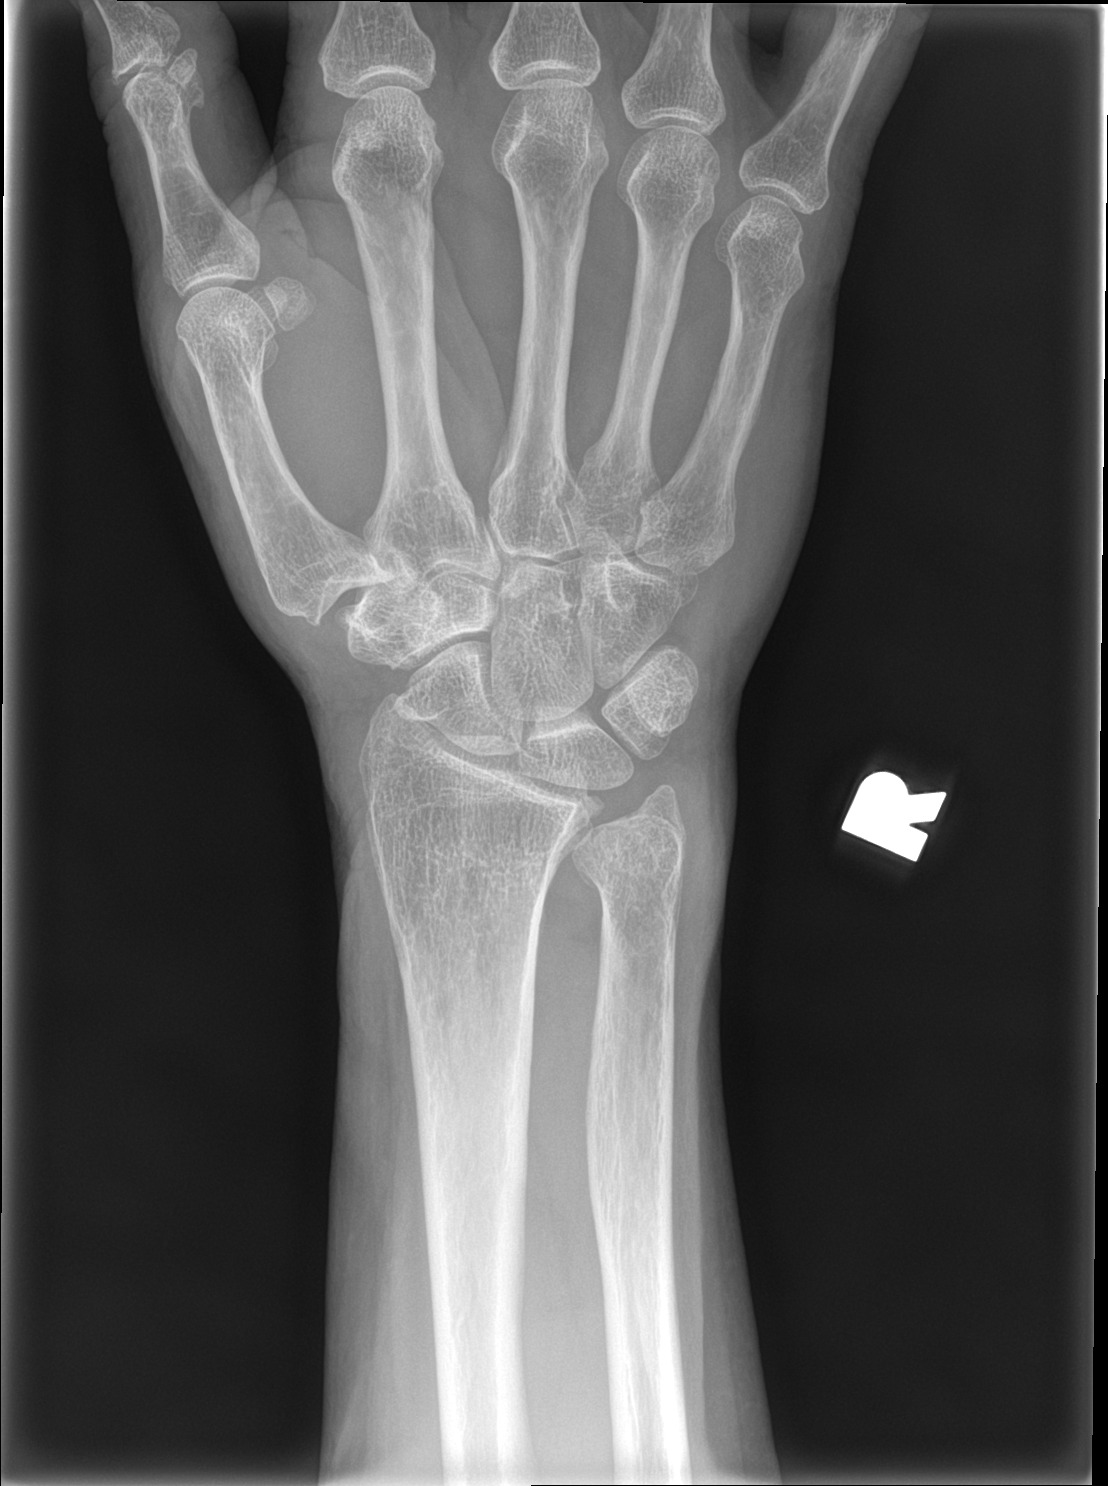

[wrist obl]
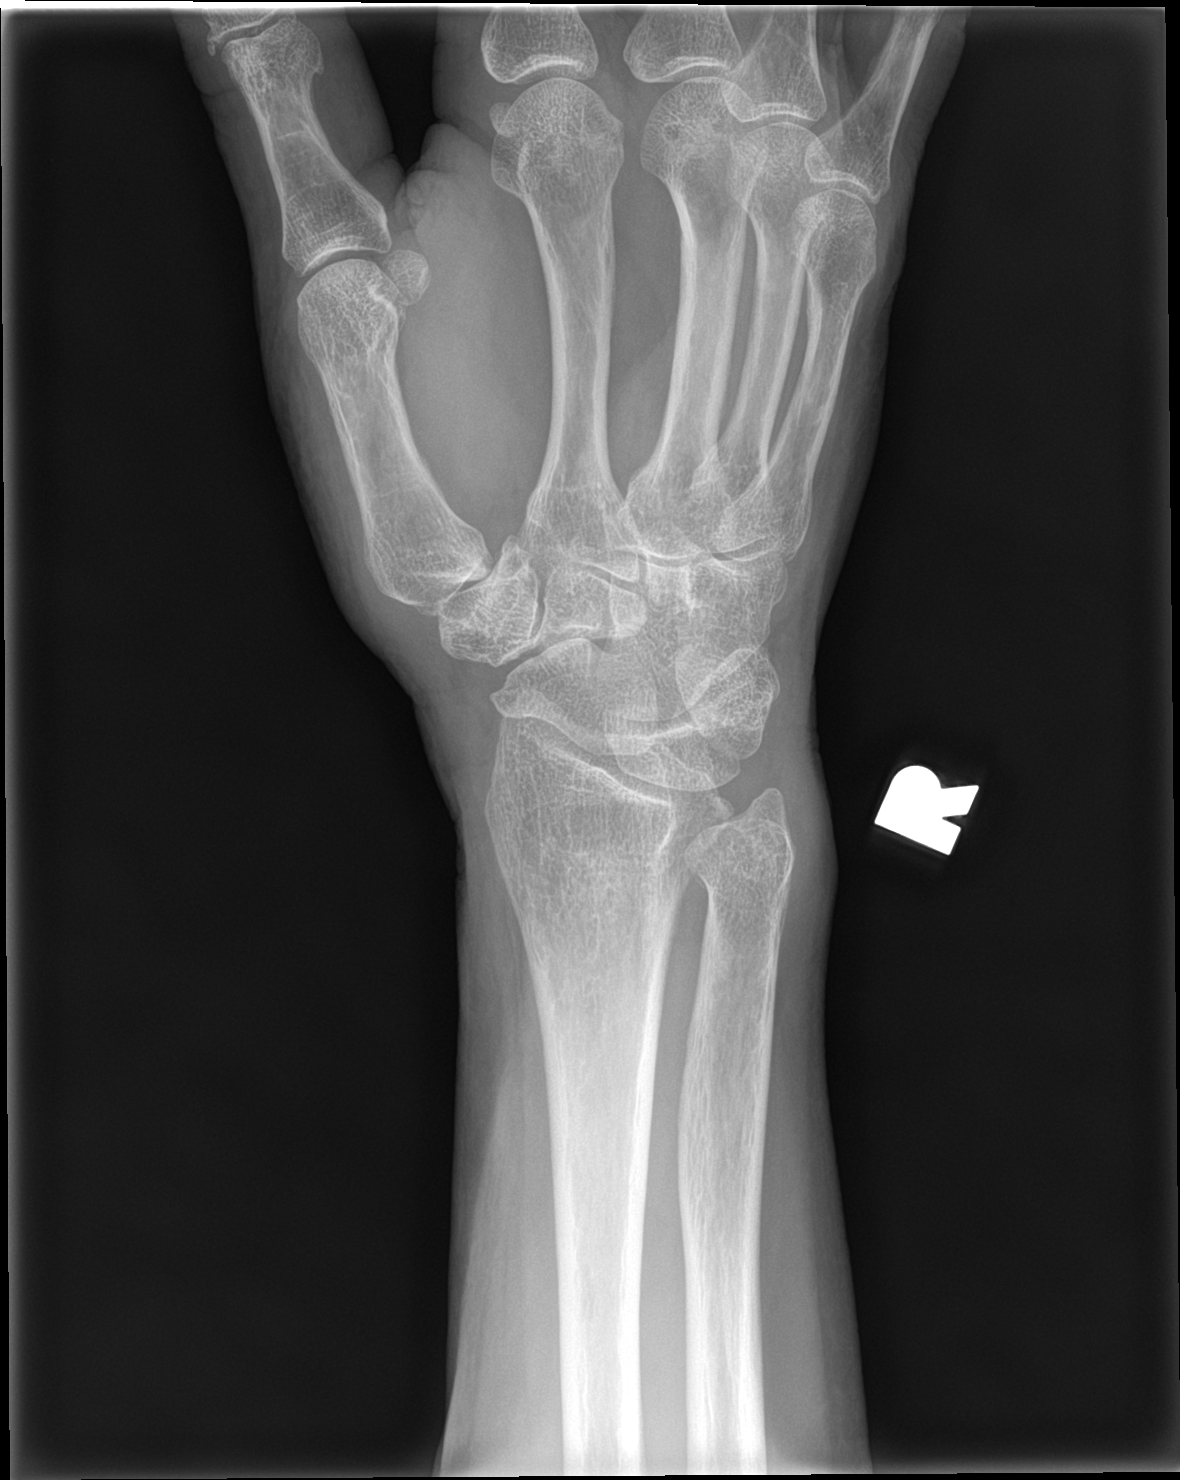

[wrist lat]
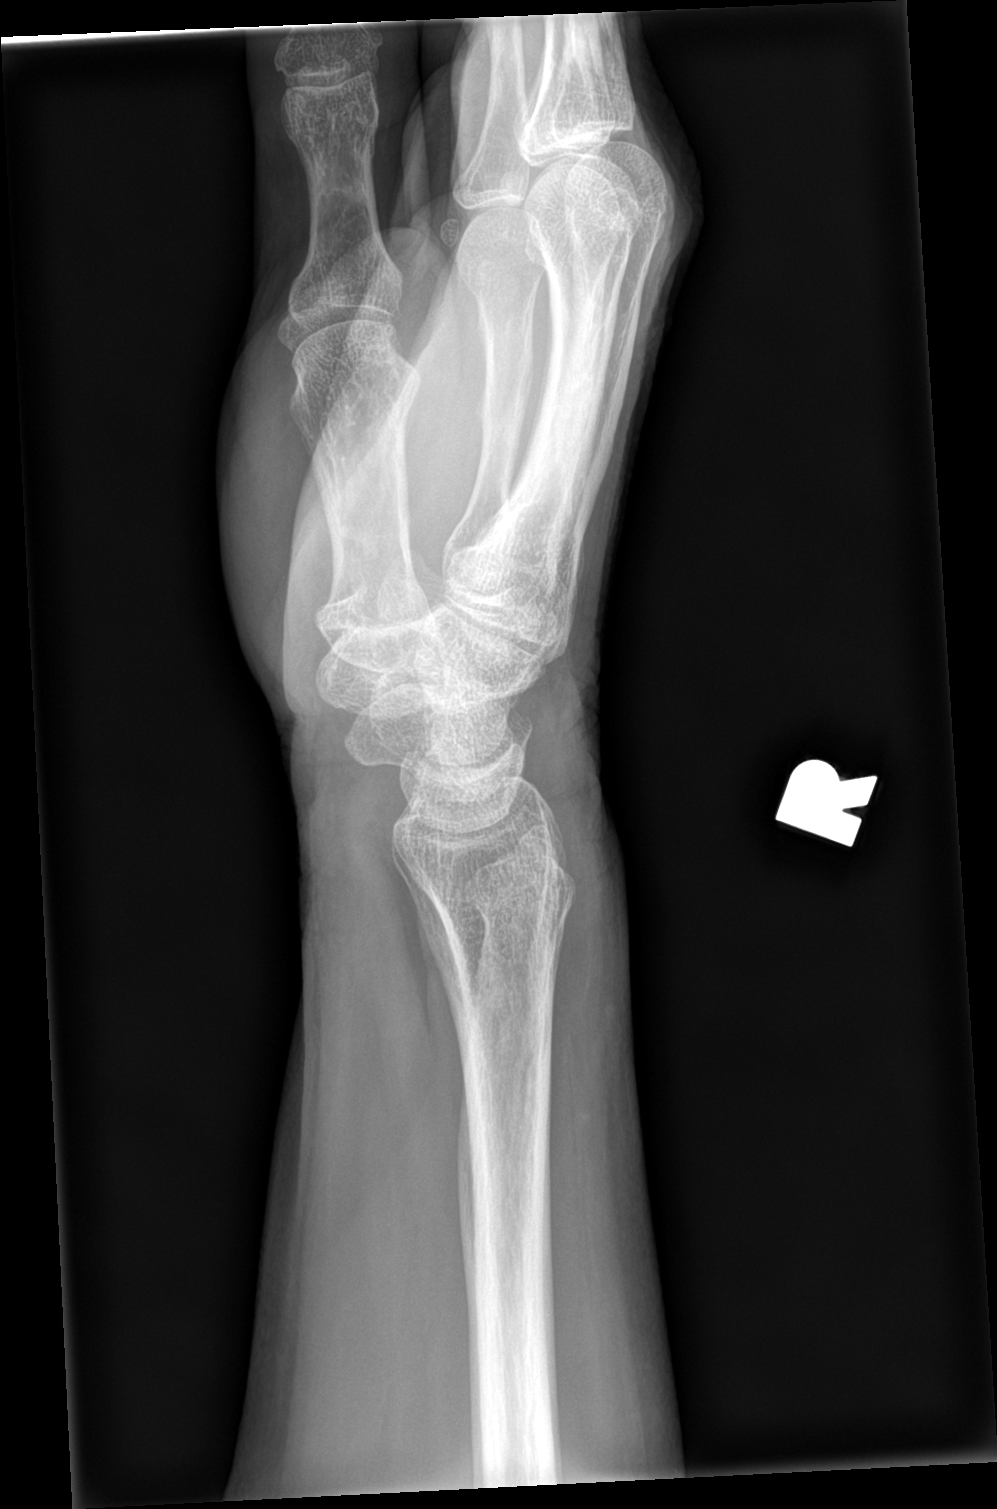

[3 of 3 positions shown; findings below may reference images not displayed]

FINDINGS: There is no evidence of fracture or dislocation. There is no
evidence of arthropathy or other focal bone abnormality. Incidental
note of ulnar minus variant. Soft tissues are unremarkable.
IMPRESSION: No fracture or dislocation of the right wrist.

## 2022-11-30 DIAGNOSIS — D225 Melanocytic nevi of trunk: Secondary | ICD-10-CM | POA: Diagnosis not present

## 2022-11-30 DIAGNOSIS — Z1283 Encounter for screening for malignant neoplasm of skin: Secondary | ICD-10-CM | POA: Diagnosis not present

## 2023-01-20 DIAGNOSIS — E7801 Familial hypercholesterolemia: Secondary | ICD-10-CM | POA: Diagnosis not present

## 2023-01-22 LAB — NMR, LIPOPROFILE
Cholesterol, Total: 174 mg/dL (ref 100–199)
HDL Particle Number: 38.5 umol/L (ref >=30.5)
HDL-C: 52 mg/dL (ref >39)
LDL Particle Number: 857 nmol/L (ref <1000)
LDL Size: 20.9 nmol (ref >20.5)
LDL-C (NIH Calc): 91 mg/dL (ref 0–99)
LP-IR Score: 52 — ABNORMAL HIGH (ref <=45)
Small LDL Particle Number: 281 nmol/L (ref <=527)
Triglycerides: 180 mg/dL — ABNORMAL HIGH (ref 0–149)

## 2023-01-31 ENCOUNTER — Ambulatory Visit (HOSPITAL_BASED_OUTPATIENT_CLINIC_OR_DEPARTMENT_OTHER): Payer: Medicare PPO | Admitting: Internal Medicine

## 2023-01-31 VITALS — BP 116/78 | HR 87 | Ht 65.0 in | Wt 149.0 lb

## 2023-01-31 DIAGNOSIS — E7801 Familial hypercholesterolemia: Secondary | ICD-10-CM | POA: Diagnosis not present

## 2023-01-31 DIAGNOSIS — E782 Mixed hyperlipidemia: Secondary | ICD-10-CM

## 2023-01-31 DIAGNOSIS — E7841 Elevated Lipoprotein(a): Secondary | ICD-10-CM

## 2023-01-31 NOTE — Progress Notes (Unsigned)
LIPID CLINIC CONSULT NOTE  Chief Complaint:  Manage dyslipidemia  Primary Care Physician: Carylon Perches, MD  Primary Cardiologist:  None  HPI:  Daisy Wu is a 67 y.o. female who is being seen today for the evaluation of dyslipidemia at the request of Carylon Perches, MD. This is a pleasant 67 year old female kindly referred for evaluation management of dyslipidemia.  She reports a history of high cholesterol in the past and brought in labs dating back to 2016.  There is been a linear increase in cholesterol from 93 before.  Her triglycerides are also increased from 135 up to more recently 402.  HDL 39 and LDL 202 based on labs in September 2023.  She has also been on statins in the past tried atorvastatin and pravastatin both of which caused significant myalgias.  She is not currently on therapy.  It was noted that there is remote heart disease in the family.  Her findings are concerning for a familial hyperlipidemia.  08/16/2022  Daisy Wu returns today for follow-up.  She seems to be tolerating Repatha well without any significant issues.  She is also made significant dietary changes and started exercising more.  She has had a robust response to therapy.  Her LDL particle number is now down to 837 with an LDL-C of 75 (down from 202), HDL 41 and triglycerides 199 (down from 402).  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  Additionally, she had an E2/E 3 variant which predicts a higher cholesterol levels.  PMHx:  Past Medical History:  Diagnosis Date   Depression    GERD (gastroesophageal reflux disease)    High cholesterol     Past Surgical History:  Procedure Laterality Date   COLONOSCOPY N/A 02/20/2017   Procedure: COLONOSCOPY;  Surgeon: West Bali, MD;  [facility];  Service: Endoscopy;  Laterality:  N/A;  9:30am   COMBINED HYSTEROSCOPY DIAGNOSTIC / D&C     HEMORRHOID BANDING N/A 02/20/2017   Procedure: HEMORRHOID BANDING;  Surgeon: West Bali, MD;  [facility];  Service: Endoscopy;  Laterality: N/A;   LIPOMA EXCISION Right    R Shoulder   LIPOMA EXCISION Right 12/06/2021   Procedure: EXCISION LIPOMA, SHOULDER;  Surgeon: Franky Macho, MD;  Location: [facility];  Service: General;  Laterality: Right;   TONSILLECTOMY     TUBAL LIGATION      FAMHx:  Family History  Problem Relation Age of Onset   Heart disease Other    Arthritis Other    Cancer Other    Colon cancer Neg Hx    Colon polyps Neg Hx     SOCHx:   reports that she has quit smoking. She has never used smokeless tobacco. She reports current alcohol use. She reports that she does not use drugs.  ALLERGIES:  Allergies  Allergen Reactions   Bactrim [Sulfamethoxazole-Trimethoprim] Other (See Comments)    causes pain in back of neck   Atorvastatin Other (See Comments)    myalgias   Pravastatin Other (See Comments)    myalgias    ROS: Pertinent items noted in HPI and remainder of comprehensive ROS otherwise negative.  HOME MEDS: Current Outpatient Medications on File Prior to Visit  Medication Sig Dispense Refill   Calcium Carbonate-Vit D-Min (CALCIUM 1200) 1200-1000 MG-UNIT CHEW Chew 1 tablet  by mouth daily.     Cholecalciferol (VITAMIN D) 50 MCG (2000 UT) tablet Take 2,000 Units by mouth in the morning.     Evolocumab (REPATHA SURECLICK) 140 MG/ML SOAJ Inject 140 mg into the skin every 14 (fourteen) days. 6 mL 3   ferrous sulfate 325 (65 FE) MG tablet Take 325 mg by mouth in the morning.     Methylcellulose, Laxative, (CITRUCEL PO) Take 2 tablets by mouth in the morning.     Omega-3 Fatty Acids (FISH OIL TRIPLE STRENGTH PO) Take 2 tablets by mouth daily. With co q 10     pantoprazole (PROTONIX) 40 MG tablet Take 40 mg by mouth daily before breakfast.     sertraline (ZOLOFT) 50 MG tablet Take 50 mg  by mouth in the morning.     No current facility-administered medications on file prior to visit.    LABS/IMAGING: No results found for this or any previous visit (from the past 48 hours). No results found.  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   Daisy Wu has had an excellent response to Repatha therapy but still has an elevated LP(a) of 231.6 nmol/L.  Her LDL is near target less than 70 with a small LDL particle number and total particle number that are well treated.  She is going to try to increase her exercise even further and I think this will only help her numbers.  I would advise that we continue with our current therapy and plan repeat lipids in about 6 months.  Follow-up with me at that time.  Chrystie Nose, MD, [facility], FACP  [place]  [facility]  Medical Director of the Advanced Lipid Disorders &  Cardiovascular Risk Reduction Clinic Diplomate of the American Board of Clinical Lipidology Attending Cardiologist  Direct Dial: [phone]  Fax: [phone]  Website:  [email or web address] [email or web address] Haque 01/31/2023, 10:07 AM

## 2023-01-31 NOTE — Patient Instructions (Signed)
Medication Instructions:  Your physician recommends that you continue on your current medications as directed. Please refer to the Current Medication list given to you today.  *If you need a refill on your cardiac medications before your next appointment, please call your pharmacy*  Lab Work: NMR about 1 week prior to follow up   Testing/Procedures: NONE  Follow-Up: At [facility], you and your health needs are our priority.  As part of our continuing mission to provide you with exceptional heart care, we have created designated Provider Care Teams.  These Care Teams include your primary Cardiologist (physician) and Advanced Practice Providers (APPs -  Physician Assistants and Nurse Practitioners) who all work together to provide you with the care you need, when you need it.  We recommend signing up for the patient portal called "MyChart".  Sign up information is provided on this After Visit Summary.  MyChart is used to connect with patients for Virtual Visits (Telemedicine).  Patients are able to view lab/test results, encounter notes, upcoming appointments, etc.  Non-urgent messages can be sent to your provider as well.   To learn more about what you can do with MyChart, go to [email or web address].    Your next appointment:   6 month(s)  The format for your next appointment:   In Person  Provider:   Dr Rennis Golden in Lipid Clinic

## 2023-02-03 ENCOUNTER — Other Ambulatory Visit (HOSPITAL_BASED_OUTPATIENT_CLINIC_OR_DEPARTMENT_OTHER): Payer: Self-pay | Admitting: Internal Medicine

## 2023-07-14 DIAGNOSIS — K219 Gastro-esophageal reflux disease without esophagitis: Secondary | ICD-10-CM | POA: Diagnosis not present

## 2023-07-14 DIAGNOSIS — E785 Hyperlipidemia, unspecified: Secondary | ICD-10-CM | POA: Diagnosis not present

## 2023-07-14 DIAGNOSIS — E7801 Familial hypercholesterolemia: Secondary | ICD-10-CM | POA: Diagnosis not present

## 2023-07-14 DIAGNOSIS — E782 Mixed hyperlipidemia: Secondary | ICD-10-CM | POA: Diagnosis not present

## 2023-07-14 DIAGNOSIS — Z79899 Other long term (current) drug therapy: Secondary | ICD-10-CM | POA: Diagnosis not present

## 2023-07-16 LAB — NMR, LIPOPROFILE
Cholesterol, Total: 148 mg/dL (ref 100–199)
HDL Particle Number: 37.6 umol/L (ref >=30.5)
HDL-C: 48 mg/dL (ref >39)
LDL Particle Number: 681 nmol/L (ref <1000)
LDL Size: 20.1 nm — ABNORMAL LOW (ref >20.5)
LDL-C (NIH Calc): 66 mg/dL (ref 0–99)
LP-IR Score: 62 — ABNORMAL HIGH (ref <=45)
Small LDL Particle Number: 392 nmol/L (ref <=527)
Triglycerides: 208 mg/dL — ABNORMAL HIGH (ref 0–149)

## 2023-07-18 ENCOUNTER — Other Ambulatory Visit (HOSPITAL_COMMUNITY): Payer: Self-pay | Admitting: Internal Medicine

## 2023-07-18 DIAGNOSIS — K219 Gastro-esophageal reflux disease without esophagitis: Secondary | ICD-10-CM | POA: Diagnosis not present

## 2023-07-18 DIAGNOSIS — Z13820 Encounter for screening for osteoporosis: Secondary | ICD-10-CM

## 2023-07-18 DIAGNOSIS — M8000XA Age-related osteoporosis with current pathological fracture, unspecified site, initial encounter for fracture: Secondary | ICD-10-CM | POA: Diagnosis not present

## 2023-07-18 DIAGNOSIS — Z0001 Encounter for general adult medical examination with abnormal findings: Secondary | ICD-10-CM | POA: Diagnosis not present

## 2023-07-18 DIAGNOSIS — E785 Hyperlipidemia, unspecified: Secondary | ICD-10-CM | POA: Diagnosis not present

## 2023-07-18 DIAGNOSIS — F411 Generalized anxiety disorder: Secondary | ICD-10-CM | POA: Diagnosis not present

## 2023-07-19 ENCOUNTER — Ambulatory Visit: Payer: Self-pay | Admitting: *Deleted

## 2023-07-20 ENCOUNTER — Ambulatory Visit (HOSPITAL_COMMUNITY)
Admission: RE | Admit: 2023-07-20 | Discharge: 2023-07-20 | Disposition: A | Discharge status: Home / Self Care | Source: Ambulatory Visit | Attending: Internal Medicine | Admitting: Internal Medicine

## 2023-07-20 DIAGNOSIS — Z13820 Encounter for screening for osteoporosis: Secondary | ICD-10-CM | POA: Diagnosis not present

## 2023-07-20 DIAGNOSIS — M810 Age-related osteoporosis without current pathological fracture: Secondary | ICD-10-CM | POA: Diagnosis not present

## 2023-07-20 DIAGNOSIS — Z780 Asymptomatic menopausal state: Secondary | ICD-10-CM | POA: Diagnosis not present

## 2023-07-26 ENCOUNTER — Other Ambulatory Visit: Payer: Self-pay

## 2023-07-27 ENCOUNTER — Telehealth: Payer: Self-pay

## 2023-07-27 NOTE — Telephone Encounter (Signed)
 Auth Submission: APPROVED Site of care: Site of care: [facility] Payer: [employer] MEDICARE Medication & CPT/J Code(s) submitted: Prolia (Denosumab) [id number] Route of submission (phone, fax, portal): PORTAL Phone # Fax # Auth type: Buy/Bill PB Units/visits requested: 60MG , Q55months Reference number: [id number] Approval from: 07/27/23 to 02/14/24

## 2023-07-28 ENCOUNTER — Encounter: Attending: Internal Medicine | Admitting: Emergency Medicine

## 2023-07-28 VITALS — BP 133/87 | HR 80 | Temp 98.1°F | Resp 18

## 2023-07-28 DIAGNOSIS — M810 Age-related osteoporosis without current pathological fracture: Secondary | ICD-10-CM

## 2023-07-28 MED ORDER — DENOSUMAB 60 MG/ML ~~LOC~~ SOSY
60.0000 mg | PREFILLED_SYRINGE | Freq: Once | SUBCUTANEOUS | Status: AC
  Administered 2023-07-28: 60 mg via SUBCUTANEOUS

## 2023-07-28 NOTE — Progress Notes (Signed)
 Diagnosis: Osteoporosis  Provider:  Artemisa Bile MD  Procedure: Injection  Prolia (Denosumab), Dose: 60 mg, Site: subcutaneous, Number of injections: 1  Injection Site(s): Left arm  Post Care: Observation period completed  Discharge: Condition: Good, Destination: Home . AVS Declined  Performed by:  Arlina Benjamin, RN

## 2023-08-03 ENCOUNTER — Ambulatory Visit (HOSPITAL_BASED_OUTPATIENT_CLINIC_OR_DEPARTMENT_OTHER): Payer: Medicare PPO | Admitting: Internal Medicine

## 2023-08-03 VITALS — BP 126/60 | HR 81 | Ht 65.0 in | Wt 147.0 lb

## 2023-08-03 DIAGNOSIS — M791 Myalgia, unspecified site: Secondary | ICD-10-CM | POA: Diagnosis not present

## 2023-08-03 DIAGNOSIS — T466X5A Adverse effect of antihyperlipidemic and antiarteriosclerotic drugs, initial encounter: Secondary | ICD-10-CM

## 2023-08-03 DIAGNOSIS — T466X5D Adverse effect of antihyperlipidemic and antiarteriosclerotic drugs, subsequent encounter: Secondary | ICD-10-CM | POA: Diagnosis not present

## 2023-08-03 DIAGNOSIS — E7841 Elevated Lipoprotein(a): Secondary | ICD-10-CM

## 2023-08-03 DIAGNOSIS — E7801 Familial hypercholesterolemia: Secondary | ICD-10-CM | POA: Diagnosis not present

## 2023-08-03 NOTE — Progress Notes (Signed)
 LIPID CLINIC CONSULT NOTE  Chief Complaint:  Follow-up dyslipidemia  Primary Care Physician: Artemisa Bile, MD  Primary Cardiologist:  None  HPI:  Daisy Wu is a 68 y.o. female who is being seen today for the evaluation of dyslipidemia at the request of Artemisa Bile, MD. This is a pleasant 68 year old female kindly referred for evaluation management of dyslipidemia.  She reports a history of high cholesterol in the past and brought in labs dating back to 2016.  There is been a linear increase in cholesterol from 93 before.  Her triglycerides are also increased from 135 up to more recently 402.  HDL 39 and LDL 202 based on labs in September 2023.  She has also been on statins in the past tried atorvastatin and pravastatin both of which caused significant myalgias.  She is not currently on therapy.  It was noted that there is remote heart disease in the family.  Her findings are concerning for a familial hyperlipidemia.  08/16/2022  Daisy Wu returns today for follow-up.  She seems to be tolerating Repatha  well without any significant issues.  She is also made significant dietary changes and started exercising more.  She has had a robust response to therapy.  Her LDL particle number is now down to 837 with an LDL-C of 75 (down from 202), HDL 41 and triglycerides 199 (down from 402).  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  Additionally, she had an E2/E 3 variant which predicts a higher cholesterol levels.  01/31/2023  Daisy Wu returns today for follow-up.  She continues to have good control of her dyslipidemia.  Her LDL particle number is relatively stable at 857, LDL has gone up slightly to 91 with HDL 52 and triglycerides are improved at 180.  08/03/2023  Daisy Wu is seen today for follow-up.  She has had a  very good response to Repatha .  Her cholesterol continues to be low in fact a little lower than it had been previously although her triglycerides have gone up somewhat.  I suspect this is dietary.  Recent lipid profile showed an LDL particle number of 681, LDL 66, triglycerides 208 and HDL 48 with a small LDL particle #392.  Previously had been noted that her LP(a) was elevated at 231 nmol/L.  We have discussed the possibility of a clinical trial for although is not clear if she would qualify and the upcoming [employer] trial we had expected to enroll in this month has been delayed.  PMHx:  Past Medical History:  Diagnosis Date   Depression    GERD (gastroesophageal reflux disease)    High cholesterol     Past Surgical History:  Procedure Laterality Date   COLONOSCOPY N/A 02/20/2017   Procedure: COLONOSCOPY;  Surgeon: Alyce Jubilee, MD;  Location: [facility];  Service: Endoscopy;  Laterality: N/A;  9:30am   COMBINED HYSTEROSCOPY DIAGNOSTIC / D&C     HEMORRHOID BANDING N/A 02/20/2017   Procedure: HEMORRHOID BANDING;  Surgeon: Alyce Jubilee, MD;  Location: [facility];  Service: Endoscopy;  Laterality: N/A;   LIPOMA EXCISION Right    R Shoulder   LIPOMA EXCISION Right 12/06/2021   Procedure: EXCISION LIPOMA, SHOULDER;  Surgeon: Alanda Allegra, MD;  Location: [facility];  Service: General;  Laterality: Right;   TONSILLECTOMY  TUBAL LIGATION      FAMHx:  Family History  Problem Relation Age of Onset   Heart disease Other    Arthritis Other    Cancer Other    Colon cancer Neg Hx    Colon polyps Neg Hx     SOCHx:   reports that she has quit smoking. She has never used smokeless tobacco. She reports current alcohol use. She reports that she does not use drugs.  ALLERGIES:  Allergies  Allergen Reactions   Bactrim [Sulfamethoxazole-Trimethoprim] Other (See Comments)    causes pain in back of neck   Atorvastatin Other (See Comments)    myalgias   Pravastatin Other (See Comments)     myalgias    ROS: Pertinent items noted in HPI and remainder of comprehensive ROS otherwise negative.  HOME MEDS: Current Outpatient Medications on File Prior to Visit  Medication Sig Dispense Refill   Calcium Carbonate-Vit D-Min (CALCIUM 1200) 1200-1000 MG-UNIT CHEW Chew 1 tablet by mouth daily.     Cholecalciferol (VITAMIN D) 50 MCG (2000 UT) tablet Take 2,000 Units by mouth in the morning.     denosumab (PROLIA) 60 MG/ML SOSY injection Inject 60 mg into the skin every 6 (six) months.     ferrous sulfate 325 (65 FE) MG tablet Take 325 mg by mouth in the morning.     Methylcellulose, Laxative, (CITRUCEL PO) Take 2 tablets by mouth in the morning.     pantoprazole (PROTONIX) 40 MG tablet Take 40 mg by mouth daily before breakfast.     REPATHA  SURECLICK 140 MG/ML SOAJ INJECT 140MG  INTO THE SKIN EVERY 14 DAYS 6 mL 3   sertraline (ZOLOFT) 50 MG tablet Take 50 mg by mouth in the morning.     No current facility-administered medications on file prior to visit.    LABS/IMAGING: No results found for this or any previous visit (from the past 48 hours). No results found.  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   Daisy Wu has had well-controlled dyslipidemia with a slight increase in triglycerides recently.  She notes that may may be more dietary discretion.  I encouraged  her to continue to work on diet as well as exercise and lifestyle continue her current medications.  We will hold on remains a possible candidate for upcoming clinical trial regarding LP(a) lowering.  She may need to have a calcium score or a running CT scan for that study to demonstrate any cardiovascular disease to aid in qualify for it potentially.  Plan otherwise follow-up with us  annually or sooner as necessary.  Hazle Lites, MD, [facility], FNLA, FACP  [place]  [facility]  Medical Director of the Advanced Lipid Disorders &  Cardiovascular Risk Reduction Clinic Diplomate of the American Board of Clinical Lipidology Attending Cardiologist  Direct Dial: [phone]  Fax: [phone]  Website:  [email or web address]  Aviva Lemmings Jaleyah Longhi 08/03/2023, 10:34 AM

## 2023-08-03 NOTE — Patient Instructions (Signed)
 Medication Instructions:  Your physician recommends that you continue on your current medications as directed. Please refer to the Current Medication list given to you today.  *If you need a refill on your cardiac medications before your next appointment, please call your pharmacy*  Follow-Up: At [facility], you and your health needs are our priority.  As part of our continuing mission to provide you with exceptional heart care, our providers are all part of one team.  This team includes your primary Cardiologist (physician) and Advanced Practice Providers or APPs (Physician Assistants and Nurse Practitioners) who all work together to provide you with the care you need, when you need it.  Your next appointment:   In 1 year with Slater Duncan NP

## 2023-09-25 DIAGNOSIS — Z1231 Encounter for screening mammogram for malignant neoplasm of breast: Secondary | ICD-10-CM | POA: Diagnosis not present

## 2023-09-25 DIAGNOSIS — R92333 Mammographic heterogeneous density, bilateral breasts: Secondary | ICD-10-CM | POA: Diagnosis not present

## 2023-11-08 DIAGNOSIS — Z1283 Encounter for screening for malignant neoplasm of skin: Secondary | ICD-10-CM | POA: Diagnosis not present

## 2023-11-08 DIAGNOSIS — L720 Epidermal cyst: Secondary | ICD-10-CM | POA: Diagnosis not present

## 2023-11-08 DIAGNOSIS — D2272 Melanocytic nevi of left lower limb, including hip: Secondary | ICD-10-CM | POA: Diagnosis not present

## 2023-11-08 DIAGNOSIS — D485 Neoplasm of uncertain behavior of skin: Secondary | ICD-10-CM | POA: Diagnosis not present

## 2023-11-08 DIAGNOSIS — D225 Melanocytic nevi of trunk: Secondary | ICD-10-CM | POA: Diagnosis not present

## 2023-11-29 DIAGNOSIS — M25511 Pain in right shoulder: Secondary | ICD-10-CM | POA: Diagnosis not present

## 2023-12-15 ENCOUNTER — Telehealth: Payer: Self-pay

## 2023-12-15 NOTE — Telephone Encounter (Signed)
 Auth Submission: APPROVED Site of care: Site of care: [facility] Payer: [employer] medicare Medication & CPT/J Code(s) submitted: Prolia  (Denosumab ) [id number] Diagnosis Code:  Route of submission (phone, fax, portal): portal Phone # Fax # Auth type: Buy/Bill PB Units/visits requested: 60mg  q41months  Reference number: [id number] Approval from: 02/15/24 to 02/13/25

## 2024-01-03 ENCOUNTER — Other Ambulatory Visit (HOSPITAL_BASED_OUTPATIENT_CLINIC_OR_DEPARTMENT_OTHER): Payer: Self-pay | Admitting: Internal Medicine

## 2024-01-29 ENCOUNTER — Encounter: Attending: Internal Medicine | Admitting: *Deleted

## 2024-01-29 VITALS — BP 124/74 | HR 89 | Temp 98.0°F | Resp 16

## 2024-01-29 DIAGNOSIS — M810 Age-related osteoporosis without current pathological fracture: Secondary | ICD-10-CM | POA: Diagnosis not present

## 2024-01-29 MED ORDER — DENOSUMAB 60 MG/ML ~~LOC~~ SOSY
60.0000 mg | PREFILLED_SYRINGE | Freq: Once | SUBCUTANEOUS | Status: AC
  Administered 2024-01-29: 09:00:00 60 mg via SUBCUTANEOUS

## 2024-01-29 NOTE — Progress Notes (Signed)
 Diagnosis: Osteoporosis  Provider:  Sheryle Carwin MD  Procedure: Injection  Prolia  (Denosumab ), Dose: 60 mg, Site: subcutaneous, Number of injections: 1  Injection Site(s): Right arm  Post Care: Observation period completed  Discharge: Condition: Good, Destination: Home . AVS Declined  Performed by:  Baldwin Darice Helling, RN

## 2024-07-30 ENCOUNTER — Ambulatory Visit
